# Patient Record
Sex: Male | Born: 1945 | Race: Black or African American | Hispanic: No | Marital: Married | Smoking: Former smoker
Health system: Southern US, Community
[De-identification: ages and names within clinical notes are randomized; demographics above are authoritative.]

## PROBLEM LIST (undated history)

## (undated) DIAGNOSIS — I2510 Atherosclerotic heart disease of native coronary artery without angina pectoris: Secondary | ICD-10-CM

## (undated) DIAGNOSIS — B1920 Unspecified viral hepatitis C without hepatic coma: Secondary | ICD-10-CM

## (undated) DIAGNOSIS — N183 Chronic kidney disease, stage 3 unspecified: Secondary | ICD-10-CM

## (undated) DIAGNOSIS — J189 Pneumonia, unspecified organism: Secondary | ICD-10-CM

## (undated) DIAGNOSIS — K635 Polyp of colon: Secondary | ICD-10-CM

## (undated) DIAGNOSIS — M79604 Pain in right leg: Secondary | ICD-10-CM

## (undated) DIAGNOSIS — M79605 Pain in left leg: Secondary | ICD-10-CM

## (undated) DIAGNOSIS — D649 Anemia, unspecified: Secondary | ICD-10-CM

## (undated) DIAGNOSIS — F32A Depression, unspecified: Secondary | ICD-10-CM

## (undated) DIAGNOSIS — I10 Essential (primary) hypertension: Secondary | ICD-10-CM

## (undated) DIAGNOSIS — Z8719 Personal history of other diseases of the digestive system: Secondary | ICD-10-CM

## (undated) DIAGNOSIS — G8929 Other chronic pain: Secondary | ICD-10-CM

## (undated) DIAGNOSIS — E7800 Pure hypercholesterolemia, unspecified: Secondary | ICD-10-CM

## (undated) DIAGNOSIS — K219 Gastro-esophageal reflux disease without esophagitis: Secondary | ICD-10-CM

## (undated) DIAGNOSIS — F329 Major depressive disorder, single episode, unspecified: Secondary | ICD-10-CM

## (undated) DIAGNOSIS — F1020 Alcohol dependence, uncomplicated: Secondary | ICD-10-CM

## (undated) DIAGNOSIS — M545 Low back pain, unspecified: Secondary | ICD-10-CM

## (undated) HISTORY — DX: Polyp of colon: K63.5

## (undated) HISTORY — PX: FOOT SURGERY: SHX648

## (undated) HISTORY — DX: Alcohol dependence, uncomplicated: F10.20

## (undated) HISTORY — PX: COLONOSCOPY W/ BIOPSIES AND POLYPECTOMY: SHX1376

## (undated) HISTORY — DX: Unspecified viral hepatitis C without hepatic coma: B19.20

## (undated) HISTORY — DX: Pain in left leg: M79.605

## (undated) HISTORY — DX: Pain in right leg: M79.604

## (undated) HISTORY — DX: Anemia, unspecified: D64.9

## (undated) HISTORY — PX: ESOPHAGOGASTRODUODENOSCOPY: SHX1529

## (undated) HISTORY — DX: Major depressive disorder, single episode, unspecified: F32.9

## (undated) HISTORY — DX: Essential (primary) hypertension: I10

## (undated) HISTORY — DX: Depression, unspecified: F32.A

---

## 1998-03-31 ENCOUNTER — Ambulatory Visit (HOSPITAL_COMMUNITY): Admission: RE | Admit: 1998-03-31 | Discharge: 1998-03-31 | Payer: Self-pay | Admitting: Cardiology

## 1999-11-29 ENCOUNTER — Encounter (INDEPENDENT_AMBULATORY_CARE_PROVIDER_SITE_OTHER): Payer: Self-pay

## 1999-11-29 ENCOUNTER — Ambulatory Visit (HOSPITAL_COMMUNITY): Admission: RE | Admit: 1999-11-29 | Discharge: 1999-11-29 | Payer: Self-pay | Admitting: Gastroenterology

## 2001-10-06 ENCOUNTER — Emergency Department (HOSPITAL_COMMUNITY): Admission: EM | Admit: 2001-10-06 | Discharge: 2001-10-06 | Payer: Self-pay | Admitting: Emergency Medicine

## 2002-10-01 ENCOUNTER — Other Ambulatory Visit (HOSPITAL_COMMUNITY): Admission: RE | Admit: 2002-10-01 | Discharge: 2002-10-17 | Payer: Self-pay | Admitting: Psychiatry

## 2003-08-17 ENCOUNTER — Encounter: Admission: RE | Admit: 2003-08-17 | Discharge: 2003-08-17 | Payer: Self-pay | Admitting: Cardiology

## 2003-08-31 ENCOUNTER — Encounter: Admission: RE | Admit: 2003-08-31 | Discharge: 2003-08-31 | Payer: Self-pay | Admitting: Cardiology

## 2003-11-12 ENCOUNTER — Encounter (INDEPENDENT_AMBULATORY_CARE_PROVIDER_SITE_OTHER): Payer: Self-pay | Admitting: *Deleted

## 2003-11-12 ENCOUNTER — Ambulatory Visit (HOSPITAL_COMMUNITY): Admission: RE | Admit: 2003-11-12 | Discharge: 2003-11-12 | Payer: Self-pay

## 2008-07-11 ENCOUNTER — Ambulatory Visit: Payer: Self-pay | Admitting: Internal Medicine

## 2008-07-25 ENCOUNTER — Encounter: Payer: Self-pay | Admitting: Internal Medicine

## 2008-07-25 ENCOUNTER — Ambulatory Visit: Payer: Self-pay | Admitting: Internal Medicine

## 2008-07-25 HISTORY — PX: COLONOSCOPY W/ BIOPSIES AND POLYPECTOMY: SHX1376

## 2008-07-28 ENCOUNTER — Encounter: Payer: Self-pay | Admitting: Internal Medicine

## 2009-11-19 ENCOUNTER — Encounter: Admission: RE | Admit: 2009-11-19 | Discharge: 2009-11-19 | Payer: Self-pay | Admitting: Cardiology

## 2010-02-07 ENCOUNTER — Encounter: Payer: Self-pay | Admitting: Cardiology

## 2011-02-14 ENCOUNTER — Telehealth: Payer: Self-pay | Admitting: Internal Medicine

## 2011-02-14 NOTE — Telephone Encounter (Signed)
Left a message for patient to call me. 

## 2011-02-14 NOTE — Telephone Encounter (Signed)
Spoke with patient and he states Dr. Shana Chute wanted him to see Dr. Juanda Chance. Asked patient to have Dr. Shana Chute to fax his notes and labs for Dr. Juanda Chance to review.

## 2011-02-16 NOTE — Telephone Encounter (Signed)
Spoke with Dr. Magda Kiel office and they will fax records and recent labs on patient.

## 2011-02-16 NOTE — Telephone Encounter (Signed)
Received labs and notes on patient. Left a message for patient to call me to schedule OV.

## 2011-02-17 ENCOUNTER — Encounter: Payer: Self-pay | Admitting: *Deleted

## 2011-02-17 NOTE — Telephone Encounter (Signed)
Spoke with patient and scheduled him on 02/23/11 at 9:30 AM.

## 2011-02-23 ENCOUNTER — Telehealth: Payer: Self-pay | Admitting: Internal Medicine

## 2011-02-23 ENCOUNTER — Ambulatory Visit: Payer: Self-pay | Admitting: Internal Medicine

## 2011-02-23 NOTE — Telephone Encounter (Signed)
Dr Juanda Chance- Do you want to charge?

## 2011-02-25 NOTE — Telephone Encounter (Signed)
No charge for late cancellation.

## 2011-04-11 ENCOUNTER — Encounter: Payer: Self-pay | Admitting: Internal Medicine

## 2011-06-08 ENCOUNTER — Ambulatory Visit: Payer: No Typology Code available for payment source | Admitting: Internal Medicine

## 2011-08-10 ENCOUNTER — Encounter: Payer: Self-pay | Admitting: Internal Medicine

## 2011-08-10 ENCOUNTER — Ambulatory Visit (INDEPENDENT_AMBULATORY_CARE_PROVIDER_SITE_OTHER): Payer: No Typology Code available for payment source | Admitting: Internal Medicine

## 2011-08-10 VITALS — BP 134/76 | HR 88 | Ht 69.0 in | Wt 177.0 lb

## 2011-08-10 DIAGNOSIS — D649 Anemia, unspecified: Secondary | ICD-10-CM

## 2011-08-10 DIAGNOSIS — B182 Chronic viral hepatitis C: Secondary | ICD-10-CM

## 2011-08-10 DIAGNOSIS — D509 Iron deficiency anemia, unspecified: Secondary | ICD-10-CM

## 2011-08-10 NOTE — Patient Instructions (Addendum)
You have been scheduled for an endoscopy with propofol. Please follow written instructions given to you at your visit today. If you use inhalers (even only as needed), please bring them with you on the day of your procedure. Your physician has requested that you have the following lab work at the [place] [facility]: CBC, Hepatic Function Panel, IBC, B12 level, PT and Ammonia CC: Dr Donia Guiles

## 2011-08-10 NOTE — Progress Notes (Signed)
Marcus Montgomery [birth year] MRN [id number]  History of Present Illness:  This is a 66 year old African American male who reports dark stools intermittently for the past several months. He denies abdominal pain, nausea, vomiting or significant GI history. We have seen him in the past for hepatitis C. He had a liver biopsy in 2005 each confirmed grade 2/4 activity and stage 3/4 fibrosis due to  Hepatitis C  with 1 regenerative nodule as well as septal fibrosis and portal inflammation. There was a limiting plate necrosis. He was treated with interferon in 2006 and currently has undetectable viral RNA by PCR. He has been followed at the [facility] [place]. His last colonoscopy was in July 2010 and it showed a hyperplastic polyp. He had a hyperplastic polyp in 2001. He is mildly anemic. His last hemoglobin in January of this year was 11.8. His hematocrit was 35.1 with an MCV of 84. He is going to have blood tests drawn at the [facility] in the next 2 weeks. He has been taking Naprosyn and ibuprofen almost on a daily basis.   Past Medical History  Diagnosis Date  . Anemia   . Hepatitis C     as per liver biopsy  . Hyperplastic colon polyp   . Hypertension   . Depression   . Alcoholism    Past Surgical History  Procedure Date  . Foot surgery     reports that he has quit smoking. He has never used smokeless tobacco. He reports that he does not drink alcohol or use illicit drugs. family history is negative for Colon cancer. No Known Allergies      Review of Systems:Denies heartburn odynophagia dysphagia  The remainder of the 10 point ROS is negative except as outlined in H&P   Physical Exam: General appearance  Well developed, in no distress. Eyes- non icteric. HEENT nontraumatic, normocephalic. Mouth no lesions, tongue papillated, no cheilosis. Neck supple without adenopathy, thyroid not enlarged, no carotid bruits, no JVD. Lungs Clear to auscultation bilaterally. Cor normal S1,  normal S2, regular rhythm, no murmur,  quiet precordium. Abdomen: Mildly protuberant with diastasis recti , normal active bowel sounds. Liver edge at costal margin. No ascites. No tenderness.  Rectal:Soft Hemoccult negative stool . Extremities no pedal edema. Scar left lower leg. Skin no lesions. Neurological alert and oriented x 3.No asterixis  Psychological normal mood and affect.  Assessment and Plan:  Problem #1 Patient with mild anemia who reports dark stools but he is Hemoccult negative on today's exam.and anemic. He has chronic liver disease followed at the [facility]. He uses anti-inflammatory agents which could be causing gastropathy and bleeding. He may also have an element of portal hypertension. If he has esophageal varices, he should stop taking Naprosyn. We will go ahead with an upper endoscopy to assess for varices and gastritis. He will need a repeat CBC, liver function tests, iron studies, B12, prothrombin time and ammonia which will be done at the [facility] and hopefully they will send the results to [place] as we have requested.  Problem #2 Colon cancer screening. He is the due for a recall colonoscopy in 2017.  Problem #3 chronic hepatitis C. Currently undetectable viral load area he has completed his therapy. He still have his liver function tests follow on regular basis. Had upper abdominal ultrasound every 2 years and check alpha-fetoprotein yearly   08/10/2011 Marcus Montgomery

## 2011-09-21 ENCOUNTER — Other Ambulatory Visit: Payer: Self-pay | Admitting: *Deleted

## 2011-09-21 ENCOUNTER — Ambulatory Visit (AMBULATORY_SURGERY_CENTER): Payer: No Typology Code available for payment source | Admitting: Internal Medicine

## 2011-09-21 ENCOUNTER — Encounter: Payer: Self-pay | Admitting: Internal Medicine

## 2011-09-21 VITALS — BP 127/74 | HR 76 | Temp 96.9°F | Resp 26 | Ht 69.0 in | Wt 177.0 lb

## 2011-09-21 DIAGNOSIS — K269 Duodenal ulcer, unspecified as acute or chronic, without hemorrhage or perforation: Secondary | ICD-10-CM

## 2011-09-21 DIAGNOSIS — K2980 Duodenitis without bleeding: Secondary | ICD-10-CM

## 2011-09-21 DIAGNOSIS — D649 Anemia, unspecified: Secondary | ICD-10-CM

## 2011-09-21 DIAGNOSIS — K210 Gastro-esophageal reflux disease with esophagitis: Secondary | ICD-10-CM

## 2011-09-21 DIAGNOSIS — K2900 Acute gastritis without bleeding: Secondary | ICD-10-CM

## 2011-09-21 MED ORDER — OMEPRAZOLE 20 MG PO CPDR: 20.0000 mg | DELAYED_RELEASE_CAPSULE | Freq: Every day | ORAL | Status: DC

## 2011-09-21 MED ORDER — SODIUM CHLORIDE 0.9 % IV SOLN: 500.0000 mL | INTRAVENOUS | Status: DC

## 2011-09-21 MED ORDER — FERROUS BISGLYCINATE CHELATE POWD: 1.0000 | Freq: Two times a day (BID) | Status: DC

## 2011-09-21 NOTE — Op Note (Signed)
[facility] [address] [place], [address]   ENDOSCOPY PROCEDURE REPORT  PATIENT: Marcus Montgomery, Marcus Montgomery  MR#: [id number] BIRTHDATE: Mar 03, 1945 , 65  yrs. old GENDER: Male ENDOSCOPIST: Hart Carwin, MD REFERRED BY:  Donia Guiles, M.D. PROCEDURE DATE:  09/21/2011 PROCEDURE:  EGD w/ biopsy ASA CLASS:     Class II INDICATIONS:  anemia.   iron deficiency anemia.   melena, Hgb 11.3, heme negative stool on 08/10/2011, taking NSAIDS,. MEDICATIONS: MAC sedation, administered by CRNA and Propofol (Diprivan) 280 mg IV TOPICAL ANESTHETIC: Cetacaine Spray  DESCRIPTION OF PROCEDURE: After the risks benefits and alternatives of the procedure were thoroughly explained, informed consent was obtained.  The [facility] [id number] endoscope was introduced through the mouth and advanced to the second portion of the duodenum. Without limitations.  The instrument was slowly withdrawn as the mucosa was fully examined.      DUODENUM: A single round, shallow and clean-based ulcer ranging between 5-2mm in size was found in the duodenal bulb.  Biopsies were taken at edge of the ulcer.   Mild duodenal inflammation was found in the duodenal bulb.  ESOPHAGUS: Reflux esophagitis was found in the lower third of the esophagus.  Esophagitis was LA Grade A: breaks across 1-2 folds, <5 mm.  A biopsy was performed.  Sample obtained for helicobacter pylori testing using rapid urease test.   No varices noted  STOMACH: There was mild antral gastropathy noted.  Cold forcep biopsies were taken at the antrum.  Multiple antral erosions covered with old blood,   The scope was then withdrawn from the patient and the procedure completed.  COMPLICATIONS: There were no complications. ENDOSCOPIC IMPRESSION: 1.   Single ulcer ranging between 5-1mm in size was found in the duodenal bulb , no stigmata of recent bleeding,likely due to NSAIDs 2.   Duodenal inflammation was found in the duodenal bulb 3.    Esophagitis consistent with reflux esophagitis in the lower third of the esophagus; biopsy 4.  NSAID gastritis, multiple esosions 5. No varices  RECOMMENDATIONS: 1.  anti-reflux regimen to be follow 2.  continue PPI 3.  Avoid NSAIDS 4. FeSO4 325 mg po tid 5. repeat CBC in 2 weeks  REPEAT EXAM: .  no recall  eSigned:  Hart Carwin, MD 09/21/2011 2:43 PM   CC:  PATIENT NAME:  Marcus Montgomery, Marcus Montgomery MR#: [id number]

## 2011-09-21 NOTE — Patient Instructions (Addendum)
Handouts were given to your care partner on anti-reflux regimen and gastritis.  Per Dr. Juanda Chance continue taking acid reducing medication.  Rx for omeprazole 20 mg take one daily given to your care partner  Avoid NSAIDS medications.  Take Iron 325 mg by mouth two times per day Rx also given.  Dr. Juanda Chance would like to repeat CBC blood work in 4 weeks the order is in the computer and you can get this done on Oct. 4th.  Please call if any questions or concerns.    YOU HAD AN ENDOSCOPIC PROCEDURE TODAY AT THE [facility]: Refer to the procedure report that was given to you for any specific questions about what was found during the examination.  If the procedure report does not answer your questions, please call your gastroenterologist to clarify.  If you requested that your care partner not be given the details of your procedure findings, then the procedure report has been included in a sealed envelope for you to review at your convenience later.  YOU SHOULD EXPECT: Some feelings of bloating in the abdomen. Passage of more gas than usual.  Walking can help get rid of the air that was put into your GI tract during the procedure and reduce the bloating. If you had a lower endoscopy (such as a colonoscopy or flexible sigmoidoscopy) you may notice spotting of blood in your stool or on the toilet paper. If you underwent a bowel prep for your procedure, then you may not have a normal bowel movement for a few days.  DIET: Your first meal following the procedure should be a light meal and then it is ok to progress to your normal diet.  A half-sandwich or bowl of soup is an example of a good first meal.  Heavy or fried foods are harder to digest and may make you feel nauseous or bloated.  Likewise meals heavy in dairy and vegetables can cause extra gas to form and this can also increase the bloating.  Drink plenty of fluids but you should avoid alcoholic beverages for 24 hours.  ACTIVITY: Your care partner  should take you home directly after the procedure.  You should plan to take it easy, moving slowly for the rest of the day.  You can resume normal activity the day after the procedure however you should NOT DRIVE or use heavy machinery for 24 hours (because of the sedation medicines used during the test).    SYMPTOMS TO REPORT IMMEDIATELY: A gastroenterologist can be reached at any hour.  During normal business hours, 8:30 AM to 5:00 PM Monday through Friday, call [phone].  After hours and on weekends, please call the GI answering service at [phone] who will take a message and have the physician on call contact you.     Following upper endoscopy (EGD)  Vomiting of blood or coffee ground material  New chest pain or pain under the shoulder blades  Painful or persistently difficult swallowing  New shortness of breath  Fever of 100F or higher  Black, tarry-looking stools  FOLLOW UP: If any biopsies were taken you will be contacted by phone or by letter within the next 1-3 weeks.  Call your gastroenterologist if you have not heard about the biopsies in 3 weeks.  Our staff will call the home number listed on your records the next business day following your procedure to check on you and address any questions or concerns that you may have at that time regarding the information  given to you following your procedure. This is a courtesy call and so if there is no answer at the home number and we have not heard from you through the emergency physician on call, we will assume that you have returned to your regular daily activities without incident.  SIGNATURES/CONFIDENTIALITY: You and/or your care partner have signed paperwork which will be entered into your electronic medical record.  These signatures attest to the fact that that the information above on your After Visit Summary has been reviewed and is understood.  Full responsibility of the confidentiality of this discharge information  lies with you and/or your care-partner.

## 2011-09-21 NOTE — Progress Notes (Signed)
No complaints noted in the recovery room. Maw   

## 2011-09-21 NOTE — Progress Notes (Signed)
Pt was in the recovery room longer than 30 mins waiting for Dr. Juanda Chance to talk with the pt and his wife.maw  No complaints noted in the recovery room. Maw  Patient did not experience any of the following events: a burn prior to discharge; a fall within the facility; wrong site/side/patient/procedure/implant event; or a hospital transfer or hospital admission upon discharge from the facility. [id number]) Patient did not have preoperative order for IV antibiotic SSI prophylaxis. [phone])

## 2011-09-22 ENCOUNTER — Other Ambulatory Visit: Payer: Self-pay | Admitting: Internal Medicine

## 2011-09-22 ENCOUNTER — Telehealth: Payer: Self-pay

## 2011-09-22 DIAGNOSIS — N289 Disorder of kidney and ureter, unspecified: Secondary | ICD-10-CM

## 2011-09-22 NOTE — Telephone Encounter (Signed)
Left message on answering machine. 

## 2011-09-26 ENCOUNTER — Encounter: Payer: Self-pay | Admitting: Internal Medicine

## 2011-09-30 ENCOUNTER — Telehealth: Payer: Self-pay | Admitting: *Deleted

## 2011-09-30 NOTE — Telephone Encounter (Signed)
Spoke with patient and reminded him of labs due week on 10/05/11.

## 2011-09-30 NOTE — Telephone Encounter (Signed)
Message copied by Daphine Deutscher on Fri Sep 30, 2011  4:50 PM ------      Message from: Daphine Deutscher      Created: Wed Sep 21, 2011  3:36 PM       Call and remind patient due for CBC for DB on 9/18

## 2011-10-12 ENCOUNTER — Other Ambulatory Visit (INDEPENDENT_AMBULATORY_CARE_PROVIDER_SITE_OTHER): Payer: No Typology Code available for payment source

## 2011-10-12 DIAGNOSIS — D649 Anemia, unspecified: Secondary | ICD-10-CM

## 2011-10-12 LAB — CBC WITH DIFFERENTIAL/PLATELET
Eosinophils Relative: 1.5 % (ref 0.0–5.0)
HCT: 35.1 % — ABNORMAL LOW (ref 39.0–52.0)
Lymphs Abs: 2.4 10*3/uL (ref 0.7–4.0)
Monocytes Relative: 8.9 % (ref 3.0–12.0)
Platelets: 219 10*3/uL (ref 150.0–400.0)
RBC: 4.19 Mil/uL — ABNORMAL LOW (ref 4.22–5.81)
WBC: 6.6 10*3/uL (ref 4.5–10.5)

## 2011-10-13 ENCOUNTER — Other Ambulatory Visit: Payer: Self-pay | Admitting: *Deleted

## 2011-10-13 DIAGNOSIS — D649 Anemia, unspecified: Secondary | ICD-10-CM

## 2011-11-24 ENCOUNTER — Telehealth: Payer: Self-pay | Admitting: *Deleted

## 2011-11-24 NOTE — Telephone Encounter (Signed)
Message copied by Daphine Deutscher on Thu Nov 24, 2011 10:56 AM ------      Message from: Daphine Deutscher      Created: Fri Oct 14, 2011  9:33 AM       Call and remind CBC due for DB 11/28/11

## 2011-11-24 NOTE — Telephone Encounter (Signed)
Left a message for patient to call me. 

## 2011-11-24 NOTE — Telephone Encounter (Signed)
Spoke with patient and reminded him of lab due for Dr. Juanda Chance.

## 2011-12-02 ENCOUNTER — Other Ambulatory Visit (INDEPENDENT_AMBULATORY_CARE_PROVIDER_SITE_OTHER): Payer: No Typology Code available for payment source

## 2011-12-02 DIAGNOSIS — D649 Anemia, unspecified: Secondary | ICD-10-CM

## 2011-12-02 DIAGNOSIS — N289 Disorder of kidney and ureter, unspecified: Secondary | ICD-10-CM

## 2011-12-02 LAB — CBC WITH DIFFERENTIAL/PLATELET
Basophils Relative: 0.5 % (ref 0.0–3.0)
Eosinophils Relative: 1.4 % (ref 0.0–5.0)
HCT: 34.9 % — ABNORMAL LOW (ref 39.0–52.0)
Lymphs Abs: 2.2 10*3/uL (ref 0.7–4.0)
MCV: 84.6 fl (ref 78.0–100.0)
Monocytes Absolute: 0.3 10*3/uL (ref 0.1–1.0)
Platelets: 188 10*3/uL (ref 150.0–400.0)
RBC: 4.13 Mil/uL — ABNORMAL LOW (ref 4.22–5.81)
WBC: 5.9 10*3/uL (ref 4.5–10.5)

## 2011-12-02 LAB — BASIC METABOLIC PANEL
BUN: 16 mg/dL (ref 6–23)
Calcium: 9.1 mg/dL (ref 8.4–10.5)
Creatinine, Ser: 1.6 mg/dL — ABNORMAL HIGH (ref 0.4–1.5)
GFR: 58.31 mL/min — ABNORMAL LOW (ref >60.00)
Glucose, Bld: 159 mg/dL — ABNORMAL HIGH (ref 70–99)

## 2012-01-18 DIAGNOSIS — M79604 Pain in right leg: Secondary | ICD-10-CM

## 2012-01-18 HISTORY — DX: Pain in right leg: M79.604

## 2012-05-18 ENCOUNTER — Other Ambulatory Visit: Payer: Self-pay | Admitting: *Deleted

## 2012-05-18 DIAGNOSIS — I739 Peripheral vascular disease, unspecified: Secondary | ICD-10-CM

## 2012-06-20 ENCOUNTER — Encounter: Payer: Self-pay | Admitting: Vascular Surgery

## 2012-06-21 ENCOUNTER — Encounter (INDEPENDENT_AMBULATORY_CARE_PROVIDER_SITE_OTHER): Payer: No Typology Code available for payment source | Admitting: *Deleted

## 2012-06-21 ENCOUNTER — Encounter: Payer: Self-pay | Admitting: Vascular Surgery

## 2012-06-21 ENCOUNTER — Ambulatory Visit (INDEPENDENT_AMBULATORY_CARE_PROVIDER_SITE_OTHER): Payer: No Typology Code available for payment source | Admitting: Vascular Surgery

## 2012-06-21 VITALS — BP 100/68 | HR 76 | Resp 16 | Ht 69.0 in | Wt 175.0 lb

## 2012-06-21 DIAGNOSIS — R531 Weakness: Secondary | ICD-10-CM | POA: Insufficient documentation

## 2012-06-21 DIAGNOSIS — I739 Peripheral vascular disease, unspecified: Secondary | ICD-10-CM | POA: Insufficient documentation

## 2012-06-21 DIAGNOSIS — M79609 Pain in unspecified limb: Secondary | ICD-10-CM | POA: Insufficient documentation

## 2012-06-21 DIAGNOSIS — R200 Anesthesia of skin: Secondary | ICD-10-CM

## 2012-06-21 DIAGNOSIS — R209 Unspecified disturbances of skin sensation: Secondary | ICD-10-CM

## 2012-06-21 DIAGNOSIS — R5383 Other fatigue: Secondary | ICD-10-CM
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
## 2012-12-21 ENCOUNTER — Other Ambulatory Visit: Payer: Self-pay | Admitting: *Deleted

## 2012-12-21 DIAGNOSIS — G8929 Other chronic pain: Secondary | ICD-10-CM

## 2013-02-02 ENCOUNTER — Ambulatory Visit
Admission: RE | Admit: 2013-02-02 | Discharge: 2013-02-02 | Disposition: A | Discharge status: Home / Self Care | Payer: Non-veteran care | Source: Ambulatory Visit | Attending: *Deleted | Admitting: *Deleted

## 2013-02-02 DIAGNOSIS — G8929 Other chronic pain: Secondary | ICD-10-CM

## 2013-02-02 DIAGNOSIS — M549 Dorsalgia, unspecified: Principal | ICD-10-CM

## 2013-02-13 ENCOUNTER — Telehealth: Payer: Self-pay | Admitting: *Deleted

## 2013-02-13 NOTE — Telephone Encounter (Signed)
Pt request refill of Hydrocodone.  The Hydrocodone rx was written by another medical facility in 2013.  I informed pt of this, and he said he has an appt with DR Paulla Dolly in 02/2013, but hurts very bad and would like to see someone soon.  I referred to schedulers.

## 2013-02-13 NOTE — Telephone Encounter (Signed)
Rescheduled pt to 02-19-13

## 2013-02-19 ENCOUNTER — Encounter: Payer: Self-pay | Admitting: Podiatry

## 2013-02-19 ENCOUNTER — Ambulatory Visit (INDEPENDENT_AMBULATORY_CARE_PROVIDER_SITE_OTHER): Payer: PRIVATE HEALTH INSURANCE | Admitting: Podiatry

## 2013-02-19 VITALS — BP 119/71 | HR 78 | Resp 16 | Ht 69.0 in | Wt 179.0 lb

## 2013-02-19 DIAGNOSIS — M778 Other enthesopathies, not elsewhere classified: Secondary | ICD-10-CM

## 2013-02-19 DIAGNOSIS — M7750 Other enthesopathy of unspecified foot: Secondary | ICD-10-CM

## 2013-02-19 DIAGNOSIS — M2040 Other hammer toe(s) (acquired), unspecified foot: Secondary | ICD-10-CM

## 2013-02-19 DIAGNOSIS — M779 Enthesopathy, unspecified: Secondary | ICD-10-CM

## 2013-02-19 DIAGNOSIS — Q828 Other specified congenital malformations of skin: Secondary | ICD-10-CM

## 2013-02-19 NOTE — Progress Notes (Signed)
Saw dr Paulla Dolly last year for this toe and the bottom of the foot, he gave me some injections, and some pads and hydrocodone. Dr Paulla Dolly said that it was just a short term fix and i need a refill on prescription , but they told me i had to see dr regal first, but they could not get me in until next week and i am in pain.   Objective: Vital signs are stable he is alert and oriented x3. Pulses are palpable left lower extremity. He has a cocked up fifth digit of his left foot which is exquisitely painful in shoe gear with an overlying reactive hyperkeratosis to the PIPJ fifth left. He also has a porokeratotic sub-fifth metatarsal head of the left foot. This is more than likely associated with the hammertoe deformity at the level of the fifth metatarsal head.  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 Porokeratotic lesion sub-fifth metatarsal of the left foot. Localized capsulitis PIPJ fifth digit left.  Plan: Discussed etiology pathology conservative versus surgical therapies. At this point I debrided all reactive hyperkeratosis his and injected dexamethasone sublesionally today for localize capsulitis of the fifth toe. I discussed in detail with him today the possibility of amputating the fifth toe as an option to alleviate his symptoms. There has been previous surgery performed at the fifth metatarsophalangeal joint that has resulted in contracture deformity of the fifth toe. I do not feel that surgical correction of this toe would be successful. I am referring him back to Dr. Paulla Dolly for surgical consideration. He was requesting Vicodin today which I declined to provide.

## 2013-02-28 ENCOUNTER — Encounter: Payer: Self-pay | Admitting: Podiatry

## 2013-02-28 ENCOUNTER — Ambulatory Visit (INDEPENDENT_AMBULATORY_CARE_PROVIDER_SITE_OTHER): Payer: PRIVATE HEALTH INSURANCE | Admitting: Podiatry

## 2013-02-28 ENCOUNTER — Ambulatory Visit: Payer: Self-pay | Admitting: Podiatry

## 2013-02-28 ENCOUNTER — Ambulatory Visit (INDEPENDENT_AMBULATORY_CARE_PROVIDER_SITE_OTHER): Payer: PRIVATE HEALTH INSURANCE

## 2013-02-28 VITALS — BP 140/86 | HR 80 | Resp 12

## 2013-02-28 DIAGNOSIS — R52 Pain, unspecified: Secondary | ICD-10-CM

## 2013-02-28 DIAGNOSIS — Z472 Encounter for removal of internal fixation device: Secondary | ICD-10-CM

## 2013-02-28 DIAGNOSIS — M2040 Other hammer toe(s) (acquired), unspecified foot: Secondary | ICD-10-CM

## 2013-02-28 DIAGNOSIS — M216X9 Other acquired deformities of unspecified foot: Secondary | ICD-10-CM

## 2013-02-28 MED ORDER — HYDROCODONE-ACETAMINOPHEN 10-325 MG PO TABS: 1.0000 | ORAL_TABLET | Freq: Three times a day (TID) | ORAL | Status: DC | PRN

## 2013-02-28 NOTE — Progress Notes (Signed)
Subjective:     Patient ID: Marcus Montgomery, male   DOB: 1945/12/16, 68 y.o.   MRN: [id number]  HPI patient presents stating I'm having a lot of pain in my left fifth toe underneath my left fifth metatarsal bone that is becoming increasingly hard for me to wear shoe gear or walk with. Had a trim by Dr. Milinda Pointer which was not effective for him   Review of Systems     Objective:   Physical Exam Neurovascular status intact with pain to palpation plantar aspect left fifth metatarsal head and pain to palpation left fifth toe with elevation of the toe secondary to previous surgery performed on the MPJ. There is contracture noted at the fifth MPJ secondary to previous work    Assessment:     Previous surgery with elevation of the fifth toe contraction at the fifth MPJ and plantar keratotic lesion fifth metatarsal head with screw in place    Plan:     Reviewed condition at great length and discussed continued conservative versus surgical treatment. Patient wants surgical treatment due to long-standing nature of deformity and increased pain over this period of time. At this point I did discuss arthroplasty versus amputation and we're going to try for arthroplasty first with the understanding of eventually he may require amputation of the fifth toe I have recommended removal of screw and fifth metatarsal head resection a long with arthroplasty fifth toe and I spent a great of time going over with him what would be done and allowing him to read a consent form [facility] discussing all possible complications that can occur with surgical intervention. Patient is scheduled for outpatient surgery for fifth metatarsal head resection arthroplasty with release of the fifth MPJ to lower the toe and understand recovery will be approximately 6 months to one year scheduled for outpatient surgery

## 2013-03-06 ENCOUNTER — Telehealth: Payer: Self-pay | Admitting: *Deleted

## 2013-03-07 NOTE — Telephone Encounter (Signed)
Forward to Lisa 

## 2013-03-08 ENCOUNTER — Telehealth: Payer: Self-pay | Admitting: *Deleted

## 2013-03-08 NOTE — Telephone Encounter (Signed)
Pt scheduled surgery with Dr Paulla Dolly for 04/23/2013 - L 5th Metatarsal Head Resection, L 5th Removal Fixation Deep, and L 5th Hammer Toe Repair.

## 2013-03-26 ENCOUNTER — Encounter: Payer: Self-pay | Admitting: Podiatry

## 2013-03-26 DIAGNOSIS — M21549 Acquired clubfoot, unspecified foot: Secondary | ICD-10-CM

## 2013-03-26 DIAGNOSIS — M2040 Other hammer toe(s) (acquired), unspecified foot: Secondary | ICD-10-CM

## 2013-03-26 DIAGNOSIS — Z472 Encounter for removal of internal fixation device: Secondary | ICD-10-CM

## 2013-03-27 ENCOUNTER — Telehealth: Payer: Self-pay | Admitting: *Deleted

## 2013-03-27 MED ORDER — HYDROCODONE-ACETAMINOPHEN 10-325 MG PO TABS: 1.0000 | ORAL_TABLET | Freq: Three times a day (TID) | ORAL | Status: DC | PRN

## 2013-03-27 NOTE — Telephone Encounter (Addendum)
Pt states the pain medicine prescribed after his surgery 03/26/2013 is giving him a horrible headache.  Pt request the same pain medicine Dr Paulla Dolly prescribed previously.  Dr Paulla Dolly orders Norco as previously ordered dispense #30.

## 2013-04-01 ENCOUNTER — Ambulatory Visit (INDEPENDENT_AMBULATORY_CARE_PROVIDER_SITE_OTHER): Payer: PRIVATE HEALTH INSURANCE

## 2013-04-01 ENCOUNTER — Ambulatory Visit (INDEPENDENT_AMBULATORY_CARE_PROVIDER_SITE_OTHER): Payer: PRIVATE HEALTH INSURANCE | Admitting: Podiatry

## 2013-04-01 VITALS — BP 162/78 | HR 77 | Resp 16 | Ht 69.0 in | Wt 178.0 lb

## 2013-04-01 DIAGNOSIS — Z9889 Other specified postprocedural states: Secondary | ICD-10-CM

## 2013-04-01 DIAGNOSIS — M2040 Other hammer toe(s) (acquired), unspecified foot: Secondary | ICD-10-CM

## 2013-04-01 NOTE — Progress Notes (Signed)
   Subjective:    Patient ID: Marcus Montgomery, male    DOB: February 07, 1945, 68 y.o.   MRN: [id number] POV1 - Left foot.  Left foot is without redness, and has slight swelling, and the suture lines are intact. HPI    Review of Systems     Objective:   Physical Exam        Assessment & Plan:

## 2013-04-01 NOTE — Progress Notes (Signed)
Subjective:     Patient ID: Marcus Montgomery, male   DOB: 12-11-1945, 68 y.o.   MRN: [id number]  HPI patient states he is doing fine with his foot and is able to walk with a minimal amount of discomfort at the current time   Review of Systems     Objective:   Physical Exam Neurovascular status intact with negative Homans sign and patient well oriented x3 incision sites on the left foot are healing well with fifth digit in good position    Assessment:     Doing well with good alignment of the left foot with wound edges well coapted no drainage or edema noted    Plan:     X-rays reviewed and discussed condition and applied sterile dressing. Reappoint for [place] to recheck again in 2 weeks for suture removal earlier if necessary

## 2013-04-09 ENCOUNTER — Telehealth: Payer: Self-pay | Admitting: *Deleted

## 2013-04-09 NOTE — Telephone Encounter (Signed)
Calling to inquire whether or not pre-medication is needed prior to doing a dental procedure.  I informed her pre-medication was not needed.  She requested a written response as well.  Note was faxed.

## 2013-04-12 ENCOUNTER — Telehealth: Payer: Self-pay | Admitting: *Deleted

## 2013-04-12 MED ORDER — HYDROCODONE-ACETAMINOPHEN 10-325 MG PO TABS: 1.0000 | ORAL_TABLET | Freq: Three times a day (TID) | ORAL | Status: DC | PRN

## 2013-04-12 NOTE — Telephone Encounter (Signed)
I called and informed him Dr. Cala Bradford the refill.  He'll have to come by to pick it up, can't be called into the pharmacy.  He stated he'll be here in about 25 minutes.

## 2013-04-12 NOTE — Telephone Encounter (Signed)
Will have to get from one of the other Doc's

## 2013-04-12 NOTE — Telephone Encounter (Signed)
I need a refill prescription Hydrocone.  Return my call let me know what's going on.

## 2013-04-15 ENCOUNTER — Telehealth: Payer: Self-pay | Admitting: *Deleted

## 2013-04-15 ENCOUNTER — Ambulatory Visit: Payer: PRIVATE HEALTH INSURANCE | Admitting: *Deleted

## 2013-04-15 VITALS — BP 111/70 | HR 92 | Resp 16 | Ht 68.0 in | Wt 179.0 lb

## 2013-04-15 DIAGNOSIS — M2040 Other hammer toe(s) (acquired), unspecified foot: Secondary | ICD-10-CM

## 2013-04-15 NOTE — Telephone Encounter (Signed)
Message copied by Lolita Rieger on Mon Apr 15, 2013  4:00 PM ------      Message from: [employer], [place] M      Created: Mon Apr 15, 2013  3:04 PM      Contact: [phone]       Patient came in this morning to have the stitches removed. Patient stated only the stitches on the top were removed and not on the bottom. Patient has questions. Please call patient back today! ------

## 2013-04-15 NOTE — Telephone Encounter (Signed)
I returned his call we will get him to see the nurse on tomorrow to remove those stitches.

## 2013-04-15 NOTE — Progress Notes (Signed)
Pt presents of suture removal of left 5th toe and lateral foot,  Suture line is clean and approximated without redness or drainage.  I removed all sutures and covered left 5th toe with coflex and fitted pt with a compression anklet.  I instructed pt to shower at night in 24 - 48 hours.  I instructed pt to use the anklet every day and to coflex the toe daily and to gradually transfer to an athletic shoe in 2 weeks.

## 2013-04-20 ENCOUNTER — Emergency Department (INDEPENDENT_AMBULATORY_CARE_PROVIDER_SITE_OTHER)
Admission: EM | Admit: 2013-04-20 | Discharge: 2013-04-20 | Disposition: A | Discharge status: Home / Self Care | Payer: 59 | Source: Home / Self Care | Attending: Family Medicine | Admitting: Family Medicine

## 2013-04-20 ENCOUNTER — Emergency Department (INDEPENDENT_AMBULATORY_CARE_PROVIDER_SITE_OTHER): Payer: Non-veteran care

## 2013-04-20 ENCOUNTER — Encounter (HOSPITAL_COMMUNITY): Payer: Self-pay | Admitting: Emergency Medicine

## 2013-04-20 DIAGNOSIS — J069 Acute upper respiratory infection, unspecified: Secondary | ICD-10-CM

## 2013-04-20 MED ORDER — AZITHROMYCIN 250 MG PO TABS: ORAL_TABLET | ORAL | Status: DC

## 2013-04-20 MED ORDER — HYDROCOD POLST-CHLORPHEN POLST 10-8 MG/5ML PO LQCR: 5.0000 mL | Freq: Two times a day (BID) | ORAL | Status: DC | PRN

## 2013-04-20 MED ORDER — FLUTICASONE PROPIONATE 50 MCG/ACT NA SUSP: 1.0000 | Freq: Two times a day (BID) | NASAL | Status: DC

## 2013-04-20 NOTE — ED Notes (Signed)
Pt  Reports  Symptoms  Of  Cough  Congestion  Body  Aches  And  Chills  For  Several  Days              Pt sitting  Upright on  exanm  Table  Speaking in  Complete  sentances  And  Is  In no  Acute  Distress

## 2013-04-20 NOTE — ED Provider Notes (Signed)
CSN: [id number]     Arrival date & time 04/20/13  1029 History   First MD Initiated Contact with Patient 04/20/13 1136     Chief Complaint  Patient presents with  . URI   (Consider location/radiation/quality/duration/timing/severity/associated sxs/prior Treatment) Patient is a 68 y.o. male presenting with URI. The history is provided by the patient.  URI Presenting symptoms: congestion, cough, fever and rhinorrhea   Severity:  Moderate Onset quality:  Gradual Duration:  1 week Progression:  Worsening Chronicity:  New Relieved by:  None tried Worsened by:  Nothing tried Ineffective treatments:  None tried Associated symptoms: no wheezing     Past Medical History  Diagnosis Date  . Anemia   . Hepatitis C     as per liver biopsy  . Hyperplastic colon polyp   . Hypertension   . Depression   . Alcoholism   . Leg pain, bilateral 2014   Past Surgical History  Procedure Laterality Date  . Foot surgery     Family History  Problem Relation Age of Onset  . Colon cancer Neg Hx   . Heart disease Father   . Hypertension Father   . Heart attack Father   . Cancer Mother   . Cancer Sister   . Heart disease Brother   . Hypertension Brother   . Heart attack Brother    History  Substance Use Topics  . Smoking status: Former Research scientist (life sciences)  . Smokeless tobacco: Never Used  . Alcohol Use: No    Review of Systems  Constitutional: Positive for fever.  HENT: Positive for congestion and rhinorrhea.   Respiratory: Positive for cough. Negative for shortness of breath and wheezing.   Gastrointestinal: Negative.     Allergies  Demerol  Home Medications   Current Outpatient Rx  Name  Route  Sig  Dispense  Refill  . acetaminophen (TYLENOL) 325 MG tablet   Oral   Take 650 mg by mouth continuous as needed for pain.         Marland Kitchen azithromycin (ZITHROMAX Z-PAK) 250 MG tablet      Take as directed on pack   6 each   0   . BENICAR HCT 40-12.5 MG per tablet      Takes one tablet by  mouth once daily         . chlorpheniramine-HYDROcodone (TUSSIONEX PENNKINETIC ER) 10-8 MG/5ML LQCR   Oral   Take 5 mLs by mouth every 12 (twelve) hours as needed for cough.   115 mL   0   . Ferrous Bisglycinate Chelate POWD   Does not apply   1 tablet by Does not apply route 2 (two) times daily.   1 Bottle   1   . fluticasone (FLONASE) 50 MCG/ACT nasal spray   Each Nare   Place 1 spray into both nostrils 2 (two) times daily.   1 g   2   . HYDROcodone-acetaminophen (NORCO) 10-325 MG per tablet   Oral   Take 1 tablet by mouth every 8 (eight) hours as needed.   30 tablet   0    BP 145/81  Pulse 78  Temp(Src) 97.9 F (36.6 C) (Oral)  Resp 18  SpO2 97% Physical Exam  Nursing note and vitals reviewed. Constitutional: He is oriented to person, place, and time. He appears well-developed and well-nourished. No distress.  HENT:  Head: Normocephalic.  Right Ear: External ear normal.  Left Ear: External ear normal.  Mouth/Throat: Oropharynx is clear and moist.  Neck:  Normal range of motion. Neck supple.  Cardiovascular: Normal heart sounds.   Pulmonary/Chest: Effort normal and breath sounds normal.  Lymphadenopathy:    He has no cervical adenopathy.  Neurological: He is alert and oriented to person, place, and time.  Skin: Skin is warm and dry.    ED Course  Procedures (including critical care time) Labs Review Labs Reviewed - No data to display Imaging Review No results found.  X-rays reviewed and report per radiologist.  MDM   1. URI (upper respiratory infection)        Billy Fischer, MD 04/22/13 2110

## 2013-04-26 NOTE — Progress Notes (Signed)
Dr Paulla Dolly ordered - removal 5th metatarsal head left foot                              - removal bone 5th toe left foot                              - removal screw left 5th  Metatarsal. Dr Paulla Dolly ordered - Demerol 50mg  #30 1 or 2 tablets every 4 to 6 hours prn pain                              - Phenergan 25mg  #30 1 or 2 tablets with the Demerol.

## 2013-05-06 ENCOUNTER — Ambulatory Visit (INDEPENDENT_AMBULATORY_CARE_PROVIDER_SITE_OTHER): Payer: PRIVATE HEALTH INSURANCE | Admitting: Podiatry

## 2013-05-06 ENCOUNTER — Encounter: Payer: Self-pay | Admitting: Podiatry

## 2013-05-06 ENCOUNTER — Ambulatory Visit (INDEPENDENT_AMBULATORY_CARE_PROVIDER_SITE_OTHER): Payer: PRIVATE HEALTH INSURANCE

## 2013-05-06 VITALS — BP 128/77 | HR 74 | Resp 16

## 2013-05-06 DIAGNOSIS — M2040 Other hammer toe(s) (acquired), unspecified foot: Secondary | ICD-10-CM

## 2013-05-06 DIAGNOSIS — M21619 Bunion of unspecified foot: Secondary | ICD-10-CM

## 2013-05-06 NOTE — Progress Notes (Signed)
Subjective:     Patient ID: Marcus Montgomery, male   DOB: 02/11/1945, 68 y.o.   MRN: [id number]  HPI patient states that my foot is feeling better and wearing tennis shoes but I still gets some swelling and discomfort at the end of the day   Review of Systems     Objective:   Physical Exam Neurovascular status intact with well-healing surgical site fifth metatarsal and fifth digit left    Assessment:     X-rays reviewed and patient's doing well with surgery    Plan:     Explained this normal still do have mild edema in the should gradually get better over the next several months but to continue with white her tight shoes and gradually increase activity levels over that.. Patient will be seen back to recheck 6 weeks earlier if any issues should occur

## 2013-06-13 ENCOUNTER — Encounter: Payer: Self-pay | Admitting: Podiatry

## 2013-06-13 ENCOUNTER — Ambulatory Visit (INDEPENDENT_AMBULATORY_CARE_PROVIDER_SITE_OTHER): Payer: PRIVATE HEALTH INSURANCE

## 2013-06-13 ENCOUNTER — Ambulatory Visit (INDEPENDENT_AMBULATORY_CARE_PROVIDER_SITE_OTHER): Payer: PRIVATE HEALTH INSURANCE | Admitting: Podiatry

## 2013-06-13 VITALS — BP 142/67 | HR 87 | Resp 16

## 2013-06-13 DIAGNOSIS — M2040 Other hammer toe(s) (acquired), unspecified foot: Secondary | ICD-10-CM

## 2013-06-13 DIAGNOSIS — M21619 Bunion of unspecified foot: Secondary | ICD-10-CM

## 2013-06-13 NOTE — Progress Notes (Signed)
Subjective:     Patient ID: Marcus Montgomery, male   DOB: [birth year], 68 y.o.   MRN: [id number]  HPI patient states I'm doing well on my left foot with occasional discomfort   Review of Systems     Objective:   Physical Exam    neurovascular status intact with no health history changes note and a well-healing surgical site left with fifth digit and good alignment Assessment:     Doing very well postoperatively    Plan:     Final x-ray reviewed patient instructed to increase activity and discharged and less needs to be seen

## 2013-08-04 ENCOUNTER — Emergency Department (INDEPENDENT_AMBULATORY_CARE_PROVIDER_SITE_OTHER)
Admission: EM | Admit: 2013-08-04 | Discharge: 2013-08-04 | Disposition: A | Discharge status: Home / Self Care | Payer: 59 | Source: Home / Self Care | Attending: Family Medicine | Admitting: Family Medicine

## 2013-08-04 ENCOUNTER — Encounter (HOSPITAL_COMMUNITY): Payer: Self-pay | Admitting: Emergency Medicine

## 2013-08-04 DIAGNOSIS — J40 Bronchitis, not specified as acute or chronic: Secondary | ICD-10-CM

## 2013-08-04 LAB — POCT RAPID STREP A: STREPTOCOCCUS, GROUP A SCREEN (DIRECT): NEGATIVE

## 2013-08-04 MED ORDER — IPRATROPIUM-ALBUTEROL 0.5-2.5 (3) MG/3ML IN SOLN
RESPIRATORY_TRACT | Status: AC
  Filled 2013-08-04: qty 3

## 2013-08-04 MED ORDER — FLUTICASONE PROPIONATE 50 MCG/ACT NA SUSP: 1.0000 | Freq: Two times a day (BID) | NASAL | Status: DC

## 2013-08-04 MED ORDER — AZITHROMYCIN 250 MG PO TABS: ORAL_TABLET | ORAL | Status: DC

## 2013-08-04 MED ORDER — IPRATROPIUM-ALBUTEROL 0.5-2.5 (3) MG/3ML IN SOLN
3.0000 mL | Freq: Once | RESPIRATORY_TRACT | Status: AC
  Administered 2013-08-04: 3 mL via RESPIRATORY_TRACT

## 2013-08-04 MED ORDER — HYDROCOD POLST-CHLORPHEN POLST 10-8 MG/5ML PO LQCR: 5.0000 mL | Freq: Two times a day (BID) | ORAL | Status: DC | PRN

## 2013-08-04 NOTE — ED Notes (Signed)
Patient complains of sore throat that is burning Some cough and congestion -symptoms started this past thursday

## 2013-08-04 NOTE — Discharge Instructions (Signed)
Thank you for coming in today. Call or go to the emergency room if you get worse, have trouble breathing, have chest pains, or palpitations.    Bronchitis Bronchitis is inflammation of the airways that extend from the windpipe into the lungs (bronchi). The inflammation often causes mucus to develop, which leads to a cough. If the inflammation becomes severe, it may cause shortness of breath. CAUSES  Bronchitis may be caused by:   Viral infections.   Bacteria.   Cigarette smoke.   Allergens, pollutants, and other irritants.  SIGNS AND SYMPTOMS  The most common symptom of bronchitis is a frequent cough that produces mucus. Other symptoms include:  Fever.   Body aches.   Chest congestion.   Chills.   Shortness of breath.   Sore throat.  DIAGNOSIS  Bronchitis is usually diagnosed through a medical history and physical exam. Tests, such as chest X-rays, are sometimes done to rule out other conditions.  TREATMENT  You may need to avoid contact with whatever caused the problem (smoking, for example). Medicines are sometimes needed. These may include:  Antibiotics. These may be prescribed if the condition is caused by bacteria.  Cough suppressants. These may be prescribed for relief of cough symptoms.   Inhaled medicines. These may be prescribed to help open your airways and make it easier for you to breathe.   Steroid medicines. These may be prescribed for those with recurrent (chronic) bronchitis. HOME CARE INSTRUCTIONS  Get plenty of rest.   Drink enough fluids to keep your urine clear or pale yellow (unless you have a medical condition that requires fluid restriction). Increasing fluids may help thin your secretions and will prevent dehydration.   Only take over-the-counter or prescription medicines as directed by your health care provider.  Only take antibiotics as directed. Make sure you finish them even if you start to feel better.  Avoid secondhand  smoke, irritating chemicals, and strong fumes. These will make bronchitis worse. If you are a smoker, quit smoking. Consider using nicotine gum or skin patches to help control withdrawal symptoms. Quitting smoking will help your lungs heal faster.   Put a cool-mist humidifier in your bedroom at night to moisten the air. This may help loosen mucus. Change the water in the humidifier daily. You can also run the hot water in your shower and sit in the bathroom with the door closed for 5-10 minutes.   Follow up with your health care provider as directed.   Wash your hands frequently to avoid catching bronchitis again or spreading an infection to others.  SEEK MEDICAL CARE IF: Your symptoms do not improve after 1 week of treatment.  SEEK IMMEDIATE MEDICAL CARE IF:  Your fever increases.  You have chills.   You have chest pain.   You have worsening shortness of breath.   You have bloody sputum.  You faint.  You have lightheadedness.  You have a severe headache.   You vomit repeatedly. MAKE SURE YOU:   Understand these instructions.  Will watch your condition.  Will get help right away if you are not doing well or get worse. Document Released: 01/03/2005 Document Revised: 10/24/2012 Document Reviewed: 08/28/2012 [facility] Patient Information 2015 [place], [place]. This information is not intended to replace advice given to you by your health care provider. Make sure you discuss any questions you have with your health care provider.

## 2013-08-04 NOTE — ED Provider Notes (Signed)
Marcus Montgomery is a 68 y.o. male who presents to Urgent Care today for cough congestion sore throat. Symptoms present for several days and consistent with prior episodes of bronchitis. Patient did well with azithromycin Tussionex and Flonase previously. He denies any fevers or chills nausea vomiting or diarrhea. He denies any trouble breathing.   Past Medical History  Diagnosis Date  . Anemia   . Hepatitis C     as per liver biopsy  . Hyperplastic colon polyp   . Hypertension   . Depression   . Alcoholism   . Leg pain, bilateral 2014   History  Substance Use Topics  . Smoking status: Former Research scientist (life sciences)  . Smokeless tobacco: Never Used  . Alcohol Use: No   ROS as above Medications: No current facility-administered medications for this encounter.   Current Outpatient Prescriptions  Medication Sig Dispense Refill  . acetaminophen (TYLENOL) 325 MG tablet Take 650 mg by mouth continuous as needed for pain.      Marland Kitchen azithromycin (ZITHROMAX Z-PAK) 250 MG tablet Take as directed on pack  6 each  0  . BENICAR HCT 40-12.5 MG per tablet Takes one tablet by mouth once daily      . chlorpheniramine-HYDROcodone (TUSSIONEX PENNKINETIC ER) 10-8 MG/5ML LQCR Take 5 mLs by mouth every 12 (twelve) hours as needed for cough.  200 mL  0  . Ferrous Bisglycinate Chelate POWD 1 tablet by Does not apply route 2 (two) times daily.  1 Bottle  1  . fluticasone (FLONASE) 50 MCG/ACT nasal spray Place 1 spray into both nostrils 2 (two) times daily.  1 g  2    Exam:  BP 179/93  Pulse 70  Temp(Src) 98.3 F (36.8 C) (Oral)  Resp 16  SpO2 99% Gen: Well NAD HEENT: EOMI,  MMM normal tympanic membranes and posterior pharynx Lungs: Normal work of breathing. CTABL Heart: RRR no MRG Abd: NABS, Soft. Nondistended, Nontender Exts: Brisk capillary refill, warm and well perfused.   Patient was given a DuoNeb nebulizer treatment and had no benefit.  Results for orders placed during the hospital encounter of 08/04/13  (from the past 24 hour(s))  POCT RAPID STREP A ([place])     Status: None   Collection Time    08/04/13  1:01 PM      Result Value Ref Range   Streptococcus, Group A Screen (Direct) NEGATIVE  NEGATIVE   No results found.  Assessment and Plan: 68 y.o. male with bronchitis. Plan to treat with azithromycin, Tussionex and Flonase as this has worked well in the past.  Discussed warning signs or symptoms. Please see discharge instructions. Patient expresses understanding.   This note was created using Systems analyst. Any transcription errors are unintended.    Gregor Hams, MD 08/04/13 [phone]

## 2013-08-06 LAB — CULTURE, GROUP A STREP

## 2014-10-17 ENCOUNTER — Encounter (HOSPITAL_COMMUNITY): Payer: Self-pay | Admitting: Emergency Medicine

## 2014-10-17 ENCOUNTER — Emergency Department (INDEPENDENT_AMBULATORY_CARE_PROVIDER_SITE_OTHER)
Admission: EM | Admit: 2014-10-17 | Discharge: 2014-10-17 | Disposition: A | Discharge status: Home / Self Care | Payer: 59 | Source: Home / Self Care | Attending: Family Medicine | Admitting: Family Medicine

## 2014-10-17 DIAGNOSIS — J302 Other seasonal allergic rhinitis: Secondary | ICD-10-CM | POA: Diagnosis not present

## 2014-10-17 MED ORDER — IPRATROPIUM BROMIDE 0.06 % NA SOLN: 2.0000 | Freq: Four times a day (QID) | NASAL | Status: DC

## 2014-10-17 NOTE — ED Provider Notes (Signed)
CSN: [id number]     Arrival date & time 10/17/14  1300 History   First MD Initiated Contact with Patient 10/17/14 1313     Chief Complaint  Patient presents with  . URI   (Consider location/radiation/quality/duration/timing/severity/associated sxs/prior Treatment) HPI Comments: [age]-year-old male presents with a two-week history of runny nose, nasal congestion, sore scratchy throat and chest burning high the upper mid chest. He states his cough is much worse when he is supine particularly at nighttime. A little better during the day. Denies fever, GU or GI symptoms. No abdominal pain. He has been taking Alka-Seltzer plus and NyQuil. Denies chest pain, heaviness, tightness, fullness or pressure. He describes burning in his throat and retro-manubrium.   Past Medical History  Diagnosis Date  . Anemia   . Hepatitis C     as per liver biopsy  . Hyperplastic colon polyp   . Hypertension   . Depression   . Alcoholism   . Leg pain, bilateral 2014   Past Surgical History  Procedure Laterality Date  . Foot surgery     Family History  Problem Relation Age of Onset  . Colon cancer Neg Hx   . Heart disease Father   . Hypertension Father   . Heart attack Father   . Cancer Mother   . Cancer Sister   . Heart disease Brother   . Hypertension Brother   . Heart attack Brother    Social History  Substance Use Topics  . Smoking status: Former Research scientist (life sciences)  . Smokeless tobacco: Never Used  . Alcohol Use: No    Review of Systems  Constitutional: Negative for fever, diaphoresis, activity change and fatigue.  HENT: Positive for congestion, postnasal drip, rhinorrhea and sore throat. Negative for ear pain, facial swelling and trouble swallowing.   Eyes: Negative for pain, discharge and redness.  Respiratory: Positive for cough. Negative for chest tightness and shortness of breath.   Cardiovascular: Negative.   Gastrointestinal: Negative.   Musculoskeletal: Negative.  Negative for neck pain and  neck stiffness.  Neurological: Negative.     Allergies  Demerol  Home Medications   Prior to Admission medications   Medication Sig Start Date End Date Taking? Authorizing Provider  BENICAR HCT 40-12.5 MG per tablet Takes one tablet by mouth once daily 06/02/11  Yes Historical Provider, MD  Multiple Vitamin (MULTIVITAMIN) tablet Take 1 tablet by mouth daily.   Yes Historical Provider, MD  acetaminophen (TYLENOL) 325 MG tablet Take 650 mg by mouth continuous as needed for pain.    Historical Provider, MD  azithromycin (ZITHROMAX Z-PAK) 250 MG tablet Take as directed on pack 08/04/13   Gregor Hams, MD  chlorpheniramine-HYDROcodone [facility] PENNKINETIC ER) 10-8 MG/5ML LQCR Take 5 mLs by mouth every 12 (twelve) hours as needed for cough. 08/04/13   Gregor Hams, MD  Ferrous Bisglycinate Chelate POWD 1 tablet by Does not apply route 2 (two) times daily. 09/21/11   Lafayette Dragon, MD  fluticasone (FLONASE) 50 MCG/ACT nasal spray Place 1 spray into both nostrils 2 (two) times daily. 08/04/13   Gregor Hams, MD  ipratropium (ATROVENT) 0.06 % nasal spray Place 2 sprays into both nostrils 4 (four) times daily. 10/17/14   Janne Napoleon, NP   Meds Ordered and Administered this Visit  Medications - No data to display  BP 149/79 mmHg  Pulse 75  Temp(Src) 98.5 F (36.9 C) (Oral)  Resp 18  SpO2 100% No data found.   Physical Exam  Constitutional: He  is oriented to person, place, and time. He appears well-developed and well-nourished. No distress.  HENT:  Mouth/Throat: No oropharyngeal exudate.  Bilateral TMs are normal Oropharynx with minor streaky erythema and scant clear PND. No exudates  Eyes: Conjunctivae and EOM are normal.  Neck: Normal range of motion. Neck supple.  Cardiovascular: Normal rate, regular rhythm and normal heart sounds.   Pulmonary/Chest: Effort normal and breath sounds normal. No respiratory distress. He has no wheezes. He has no rales.  Musculoskeletal: Normal range of  motion. He exhibits no edema.  Lymphadenopathy:    He has no cervical adenopathy.  Neurological: He is alert and oriented to person, place, and time.  Skin: Skin is warm and dry. No rash noted.  Psychiatric: He has a normal mood and affect.  Nursing note and vitals reviewed.   ED Course  Procedures (including critical care time)  Labs Review Labs Reviewed - No data to display  Imaging Review No results found.   Visual Acuity Review  Right Eye Distance:   Left Eye Distance:   Bilateral Distance:    Right Eye Near:   Left Eye Near:    Bilateral Near:         MDM   1. Other seasonal allergic rhinitis    No signs or symptoms or source identified of bacterial etiology. We will treat typical allergy symptoms as below.  Continue with Flonase nasal spray May use Allegra or Zyrtec daily for drainage and allergy symptoms Atrovent nasal spray as directed Drink plenty of fluids and stay well-hydrated   Janne Napoleon, NP 10/17/14 1339

## 2014-10-17 NOTE — ED Notes (Signed)
C/o cold sx onset 2 weeks  Sx include dry cough, sneezing, congestion, ST and chest pain  Denies fevers, chills Alert and oriented x4... No acute distress.

## 2014-10-17 NOTE — Discharge Instructions (Signed)
Allergic Rhinitis Continue with Flonase nasal spray May use Allegra or Zyrtec daily for drainage and allergy symptoms Atrovent nasal spray as directed Drink plenty of fluids and stay well-hydrated Allergic rhinitis is when the mucous membranes in the nose respond to allergens. Allergens are particles in the air that cause your body to have an allergic reaction. This causes you to release allergic antibodies. Through a chain of events, these eventually cause you to release histamine into the blood stream. Although meant to protect the body, it is this release of histamine that causes your discomfort, such as frequent sneezing, congestion, and an itchy, runny nose.  CAUSES  Seasonal allergic rhinitis (hay fever) is caused by pollen allergens that may come from grasses, trees, and weeds. Year-round allergic rhinitis (perennial allergic rhinitis) is caused by allergens such as house dust mites, pet dander, and mold spores.  SYMPTOMS   Nasal stuffiness (congestion).  Itchy, runny nose with sneezing and tearing of the eyes. DIAGNOSIS  Your health care provider can help you determine the allergen or allergens that trigger your symptoms. If you and your health care provider are unable to determine the allergen, skin or blood testing may be used. TREATMENT  Allergic rhinitis does not have a cure, but it can be controlled by:  Medicines and allergy shots (immunotherapy).  Avoiding the allergen. Hay fever may often be treated with antihistamines in pill or nasal spray forms. Antihistamines block the effects of histamine. There are over-the-counter medicines that may help with nasal congestion and swelling around the eyes. Check with your health care provider before taking or giving this medicine.  If avoiding the allergen or the medicine prescribed do not work, there are many new medicines your health care provider can prescribe. Stronger medicine may be used if initial measures are ineffective.  Desensitizing injections can be used if medicine and avoidance does not work. Desensitization is when a patient is given ongoing shots until the body becomes less sensitive to the allergen. Make sure you follow up with your health care provider if problems continue. HOME CARE INSTRUCTIONS It is not possible to completely avoid allergens, but you can reduce your symptoms by taking steps to limit your exposure to them. It helps to know exactly what you are allergic to so that you can avoid your specific triggers. SEEK MEDICAL CARE IF:   You have a fever.  You develop a cough that does not stop easily (persistent).  You have shortness of breath.  You start wheezing.  Symptoms interfere with normal daily activities. Document Released: 09/28/2000 Document Revised: 01/08/2013 Document Reviewed: 09/10/2012 [facility] Patient Information 2015 [place], [place]. This information is not intended to replace advice given to you by your health care provider. Make sure you discuss any questions you have with your health care provider.

## 2015-01-14 ENCOUNTER — Ambulatory Visit (INDEPENDENT_AMBULATORY_CARE_PROVIDER_SITE_OTHER): Payer: PRIVATE HEALTH INSURANCE | Admitting: Family Medicine

## 2015-01-14 VITALS — BP 142/78 | HR 101 | Temp 98.5°F | Resp 16 | Ht 70.0 in | Wt 177.4 lb

## 2015-01-14 DIAGNOSIS — J329 Chronic sinusitis, unspecified: Secondary | ICD-10-CM | POA: Diagnosis not present

## 2015-01-14 DIAGNOSIS — J069 Acute upper respiratory infection, unspecified: Secondary | ICD-10-CM

## 2015-01-14 DIAGNOSIS — R05 Cough: Secondary | ICD-10-CM

## 2015-01-14 DIAGNOSIS — J310 Chronic rhinitis: Secondary | ICD-10-CM

## 2015-01-14 DIAGNOSIS — R059 Cough, unspecified: Secondary | ICD-10-CM

## 2015-01-14 MED ORDER — IPRATROPIUM BROMIDE 0.03 % NA SOLN: 2.0000 | Freq: Two times a day (BID) | NASAL | Status: DC

## 2015-01-14 MED ORDER — HYDROCODONE-HOMATROPINE 5-1.5 MG/5ML PO SYRP: 5.0000 mL | ORAL_SOLUTION | ORAL | Status: DC | PRN

## 2015-01-14 MED ORDER — AMOXICILLIN 875 MG PO TABS: 875.0000 mg | ORAL_TABLET | Freq: Two times a day (BID) | ORAL | Status: DC

## 2015-01-14 MED ORDER — BENZONATATE 100 MG PO CAPS: ORAL_CAPSULE | ORAL | Status: DC

## 2015-01-14 NOTE — Progress Notes (Addendum)
Patient ID: Marcus Montgomery, male    DOB: [birth year]  Age: 69 y.o. MRN: [id number]  Chief Complaint  Patient presents with  . Sinusitis    x 1 week  . Cough    x 1 week  . Sore Throat    x 1 week  . Generalized Body Aches    x 1week  . Chills    Subjective:   69 year old man who is retired. He says he has had more respiratory infections this year than usual. For about a week he has had an upper respiratory infection with runny nose and coughing. The cough is keeping him awake. He has been having chills but no documented fevers. He did have his flu shot as he does every year. He does not smoke. His wife is not ill. He has been blowing yellow and light green mucus from his nose and coughing up some more. He has chills.  Current allergies, medications, problem list, past/family and social histories reviewed.  Objective:  BP 142/78 mmHg  Pulse 101  Temp(Src) 98.5 F (36.9 C) (Oral)  Resp 16  Ht 5\' 10"  (1.778 m)  Wt 177 lb 6.4 oz (80.468 kg)  BMI 25.45 kg/m2  SpO2 98%  No major acute distress. Talks a little nasally because of pain congestion. His TMs are normal. Nose congested. Throat clear, mild uvula edema. Without significant nodes. Chest is clear to auscultation. No rhonchi, rales, or wheezes. Heart regular without murmurs.  Assessment & Plan:   Assessment: 1. Cough   2. Acute upper respiratory infection   3. Rhinosinusitis       Plan: Prolonged upper a story infection, probably viral in origin, could be transitioning to bacterial sinusitis. Will go ahead and treat cyst is been week.  No orders of the defined types were placed in this encounter.    Meds ordered this encounter  Medications  . amoxicillin (AMOXIL) 875 MG tablet    Sig: Take 1 tablet (875 mg total) by mouth 2 (two) times daily.    Dispense:  20 tablet    Refill:  0  . ipratropium (ATROVENT) 0.03 % nasal spray    Sig: Place 2 sprays into both nostrils 2 (two) times daily.    Dispense:  30 mL   Refill:  0  . benzonatate (TESSALON PERLES) 100 MG capsule    Sig: Take 1-2 pills 3 times daily as needed for daytime cough    Dispense:  20 capsule    Refill:  0  . DISCONTD: HYDROcodone-homatropine (HYCODAN) 5-1.5 MG/5ML syrup    Sig: Take 5 mLs by mouth every 4 (four) hours as needed.    Dispense:  120 mL    Refill:  0         Patient Instructions  Drink plenty of fluids and try to get more rest than usual  Take the benzonatate cough pills one or 2 pills 3 times daily as needed for daytime cough  Take the Hycodan cough syrup 1 teaspoon every 4-6 hours as needed for nighttime cough. May cause drowsiness.  Take the amoxicillin one twice daily for infection  Use the Atrovent ipratropium nasal spray 2 sprays in each nostril 3 or 4 times daily as needed for head congestion and drainage  Return if high improving     Return if symptoms worsen or fail to improve.   [name], MD 01/19/2015

## 2015-01-14 NOTE — Patient Instructions (Signed)
Drink plenty of fluids and try to get more rest than usual  Take the benzonatate cough pills one or 2 pills 3 times daily as needed for daytime cough  Take the Hycodan cough syrup 1 teaspoon every 4-6 hours as needed for nighttime cough. May cause drowsiness.  Take the amoxicillin one twice daily for infection  Use the Atrovent ipratropium nasal spray 2 sprays in each nostril 3 or 4 times daily as needed for head congestion and drainage  Return if high improving

## 2015-01-18 ENCOUNTER — Telehealth: Payer: Self-pay

## 2015-01-18 ENCOUNTER — Ambulatory Visit (INDEPENDENT_AMBULATORY_CARE_PROVIDER_SITE_OTHER): Payer: PRIVATE HEALTH INSURANCE | Admitting: Internal Medicine

## 2015-01-18 VITALS — BP 136/70 | HR 107 | Temp 98.0°F | Resp 16 | Ht 70.5 in | Wt 177.0 lb

## 2015-01-18 DIAGNOSIS — J069 Acute upper respiratory infection, unspecified: Secondary | ICD-10-CM | POA: Diagnosis not present

## 2015-01-18 DIAGNOSIS — R059 Cough, unspecified: Secondary | ICD-10-CM

## 2015-01-18 DIAGNOSIS — R05 Cough: Secondary | ICD-10-CM

## 2015-01-18 MED ORDER — HYDROCODONE-HOMATROPINE 5-1.5 MG/5ML PO SYRP: 5.0000 mL | ORAL_SOLUTION | Freq: Four times a day (QID) | ORAL | Status: DC | PRN

## 2015-01-18 NOTE — Progress Notes (Signed)
Subjective:  By signing my name below, I, Moises Blood, attest that this documentation has been prepared under the direction and in the presence of Tami Lin, MD. Electronically Signed: Moises Blood, [place]. 01/18/2015 , 3:55 PM .  Patient was seen in Room 1 .   Patient ID: Marcus Montgomery, male    DOB: 1945/06/17, 70 y.o.   MRN: [id number] Chief Complaint  Patient presents with  . Follow-up   HPI Marcus Montgomery is a 70 y.o. male who presents to [facility] for follow up on URI. He was seen in the office 4 days ago by Dr. Linna Darner. He was prescribed amoxicillin, atrovent, tessalon perles and hycodan. He states that he still has some irritation and cough. He denies history of asthma. He denies going to the [facility] this past week with the illness. He denies shortness of breath and wheezing prior to this illness.   He usually goes to the [facility] for exercise regularly.   Patient Active Problem List   Diagnosis Date Noted  . Numbness-Bilateral leg 06/21/2012  . Weakness-Bilateral leg 06/21/2012  . Pain in limb-Bilateral leg 06/21/2012  . Peripheral vascular disease, unspecified ([place]) 06/21/2012    Current outpatient prescriptions:  .  acetaminophen (TYLENOL) 325 MG tablet, Take 650 mg by mouth continuous as needed for pain. Reported on 01/14/2015, Disp: , Rfl:  .  amoxicillin (AMOXIL) 875 MG tablet, Take 1 tablet (875 mg total) by mouth 2 (two) times daily., Disp: 20 tablet, Rfl: 0 .  BENICAR HCT 40-12.5 MG per tablet, Takes one tablet by mouth once daily, Disp: , Rfl:  .  Ferrous Bisglycinate Chelate POWD, 1 tablet by Does not apply route 2 (two) times daily., Disp: 1 Bottle, Rfl: 1 .  HYDROcodone-homatropine (HYCODAN) 5-1.5 MG/5ML syrup, Take 5 mLs by mouth every 4 (four) hours as needed., Disp: 120 mL, Rfl: 0 .  ipratropium (ATROVENT) 0.03 % nasal spray, Place 2 sprays into both nostrils 2 (two) times daily., Disp: 30 mL, Rfl: 0 .  Multiple Vitamin (MULTIVITAMIN) tablet, Take 1 tablet by  mouth daily., Disp: , Rfl:  .  azithromycin (ZITHROMAX Z-PAK) 250 MG tablet, Take as directed on pack, Disp: 6 each, Rfl: 0 .  benzonatate (TESSALON PERLES) 100 MG capsule, Take 1-2 pills 3 times daily as needed for daytime cough (Patient not taking: Reported on 01/18/2015), Disp: 20 capsule, Rfl: 0 .  chlorpheniramine-HYDROcodone (TUSSIONEX PENNKINETIC ER) 10-8 MG/5ML LQCR, Take 5 mLs by mouth every 12 (twelve) hours as needed for cough. (Patient not taking: Reported on 01/18/2015), Disp: 200 mL, Rfl: 0  Review of Systems  Constitutional: Negative for fever, chills and fatigue.  HENT: Positive for sore throat. Negative for congestion, rhinorrhea and sneezing.   Respiratory: Positive for cough. Negative for chest tightness, shortness of breath and wheezing.   Gastrointestinal: Negative for nausea, vomiting and abdominal pain.      Objective:   Physical Exam  Constitutional: He is oriented to person, place, and time. He appears well-developed and well-nourished. No distress.  HENT:  Head: Normocephalic and atraumatic.  Eyes: EOM are normal. Pupils are equal, round, and reactive to light.  Neck: Neck supple.  Cardiovascular: Normal rate.   Pulmonary/Chest: Effort normal. No respiratory distress.  Musculoskeletal: Normal range of motion.  Neurological: He is alert and oriented to person, place, and time.  Skin: Skin is warm and dry.  Psychiatric: He has a normal mood and affect. His behavior is normal.  Nursing note and vitals reviewed.   BP  136/70 mmHg  Pulse 107  Temp(Src) 98 F (36.7 C) (Oral)  Resp 16  Ht 5' 10.5" (1.791 m)  Wt 177 lb (80.287 kg)  BMI 25.03 kg/m2  SpO2 97%     Assessment & Plan:  I have completed the patient encounter in its entirety as documented by the scribe, with editing by me where necessary. Sheila Ocasio P. Laney Pastor, M.D.  Cough - Plan: HYDROcodone-homatropine (HYCODAN) 5-1.5 MG/5ML syrup  Acute upper respiratory infection  Contin meds per Dr Linna Darner Meds  ordered this encounter  Medications  . HYDROcodone-homatropine (HYCODAN) 5-1.5 MG/5ML syrup    Sig: Take 5 mLs by mouth every 6 (six) hours as needed.    Dispense:  240 mL    Refill:  0

## 2015-01-18 NOTE — Telephone Encounter (Signed)
PATIENT STATES HE SAW DR. HOPPER ON Wednesday FOR A COLD AND COUGH. HE WAS GIVEN HYDROCODONE HOMATROPINE SYRUP. HE WOULD LIKE TO GET A REFILL ON IT. AFTER TODAY HE WILL ALMOST BE OUT. HE IS ABOUT THE SAME WITH THE SORE THROAT AND COUGH. IT'S ALSO HARD FOR HIM TO TALK. BEST PHONE [phone]  [place]   [place]

## 2015-01-19 MED ORDER — HYDROCODONE-HOMATROPINE 5-1.5 MG/5ML PO SYRP: 5.0000 mL | ORAL_SOLUTION | Freq: Four times a day (QID) | ORAL | Status: DC | PRN

## 2015-01-19 NOTE — Telephone Encounter (Signed)
Okay to refill the hycodan once.  I will be at the office later and can sign it.  I cannot print it from home.

## 2015-01-19 NOTE — Telephone Encounter (Signed)
Rx printed. Asked Marcus Montgomery if she would give this to you.

## 2015-01-20 NOTE — Telephone Encounter (Signed)
Called pt to advise Rx is ready and he stated that he came back into the office on 1/1, and was given a RF then, so he does not need this RF. I will shred the one written on 1/2.

## 2015-01-20 NOTE — Telephone Encounter (Signed)
Thank you :)

## 2015-05-22 ENCOUNTER — Encounter: Payer: Self-pay | Admitting: Internal Medicine

## 2015-06-18 HISTORY — PX: CARDIAC CATHETERIZATION: SHX172

## 2015-07-02 ENCOUNTER — Encounter: Payer: Self-pay | Admitting: Gastroenterology

## 2015-07-20 ENCOUNTER — Telehealth: Payer: Self-pay | Admitting: Internal Medicine

## 2015-07-20 NOTE — Telephone Encounter (Signed)
Received records from Dept of [facility] for appointment with Dr Debara Pickett 08/26/15.  Records given to [facility] (medical records) for Dr Lysbeth Penner schedule on 08/26/15.

## 2015-07-23 ENCOUNTER — Encounter: Payer: Self-pay | Admitting: Cardiovascular Disease

## 2015-07-23 ENCOUNTER — Ambulatory Visit (INDEPENDENT_AMBULATORY_CARE_PROVIDER_SITE_OTHER): Payer: 59 | Admitting: Cardiovascular Disease

## 2015-07-23 VITALS — BP 116/70 | HR 59 | Ht 69.0 in | Wt 178.0 lb

## 2015-07-23 DIAGNOSIS — N183 Chronic kidney disease, stage 3 unspecified: Secondary | ICD-10-CM

## 2015-07-23 DIAGNOSIS — I25118 Atherosclerotic heart disease of native coronary artery with other forms of angina pectoris: Secondary | ICD-10-CM

## 2015-07-23 DIAGNOSIS — E785 Hyperlipidemia, unspecified: Secondary | ICD-10-CM | POA: Diagnosis not present

## 2015-07-23 DIAGNOSIS — R9439 Abnormal result of other cardiovascular function study: Secondary | ICD-10-CM

## 2015-07-23 DIAGNOSIS — R931 Abnormal findings on diagnostic imaging of heart and coronary circulation: Secondary | ICD-10-CM | POA: Diagnosis not present

## 2015-07-23 NOTE — Patient Instructions (Signed)
Your physician has requested that you have a cardiac catheterization on Tuesday, July 11th, with either Dr Martinique or Dr Claiborne Billings. Cardiac catheterization is used to diagnose and/or treat various heart conditions. Doctors may recommend this procedure for a number of different reasons. The most common reason is to evaluate chest pain. Chest pain can be a symptom of coronary artery disease (CAD), and cardiac catheterization can show whether plaque is narrowing or blocking your heart's arteries. This procedure is also used to evaluate the valves, as well as measure the blood flow and oxygen levels in different parts of your heart. For further information please visit [facility]uge[facility]iest[facility][email or web address].   Following your catheterization, you will not be allowed to drive for 3 days. No lifting, pushing, or pulling greater that 10 pounds is allowed for 1 week.  **Procedure should be performed in the afternoon. Patient should be admitted that morning (at least 6 hours prior to procedure) for hydration.

## 2015-07-23 NOTE — Progress Notes (Signed)
Cardiology Office Note    Date:  07/24/2015   ID:  WYNDELL LEPINE, DOB Apr 15, 1945, MRN [id number]  PCP:  Patricia Nettle, MD  Cardiologist:   Sanda Klein, MD   Chief Complaint  Patient presents with  . New Evaluation    pt states he needs a stent    History of Present Illness:  Marcus Montgomery is a 70 y.o. male referred from the [facility] for percutaneous revascularization.   He presented with complaints of functional class II exertional dyspnea (less clear inconsistent mild exertional chest tightness) and underwent a nuclear stress test on June 9. This was an equivocal study, with mildly reversible defects in the mid to apical inferolateral and anteroseptal walls. The ejection fraction was 74%. He subsequently underwent cardiac catheterization and this showed the presence of an 80% mid RCA stenosis. Additional moderate atherosclerotic lesions were seen in the left coronary system (distal left main 20%, proximal LAD 50%, mid LAD 40%, circumflex 20%). Only 40 mL of Omnipaque were used for the study since he has moderate chronic kidney disease. Left ventricular systolic function was not evaluated by angiography. Left ventricular end-diastolic pressure was 16 mmHg. He was started on clopidogrel and referred for percutaneous revascularization to our institution.  Significant comorbid conditions include hypertension, hyperlipidemia chronic hepatitis C, posttraumatic stress disorder, gastroesophageal reflux disease, and most importantly chronic kidney disease. His creatinine was 2.08 on June 23, improved to 1.7 on June 26: his GFR is roughly 50 mL/minute.  Family history is significant for coronary artery disease, albeit not particularly premature ages (father 12 years old, brother 59 years old).  Past Medical History  Diagnosis Date  . Anemia   . Hepatitis C     as per liver biopsy  . Hyperplastic colon polyp   . Hypertension   . Depression   . Alcoholism ([place])   .  Leg pain, bilateral 2014    Past Surgical History  Procedure Laterality Date  . Foot surgery      Current Medications: Outpatient Prescriptions Prior to Visit  Medication Sig Dispense Refill  . BENICAR HCT 40-12.5 MG per tablet Takes one tablet by mouth once daily    . Ferrous Bisglycinate Chelate POWD 1 tablet by Does not apply route 2 (two) times daily. 1 Bottle 1  . HYDROcodone-homatropine (HYCODAN) 5-1.5 MG/5ML syrup Take 5 mLs by mouth every 6 (six) hours as needed. 240 mL 0  . ipratropium (ATROVENT) 0.03 % nasal spray Place 2 sprays into both nostrils 2 (two) times daily. 30 mL 0  . Multiple Vitamin (MULTIVITAMIN) tablet Take 1 tablet by mouth daily.    Marland Kitchen acetaminophen (TYLENOL) 325 MG tablet Take 650 mg by mouth continuous as needed for pain. Reported on 01/14/2015    . amoxicillin (AMOXIL) 875 MG tablet Take 1 tablet (875 mg total) by mouth 2 (two) times daily. 20 tablet 0  . azithromycin (ZITHROMAX Z-PAK) 250 MG tablet Take as directed on pack 6 each 0  . benzonatate (TESSALON PERLES) 100 MG capsule Take 1-2 pills 3 times daily as needed for daytime cough (Patient not taking: Reported on 07/23/2015) 20 capsule 0   No facility-administered medications prior to visit.     Allergies:   Demerol   Social History   Social History  . Marital Status: Married    Spouse Name: N/A  . Number of Children: 2  . Years of Education: N/A   Occupational History  . Retired    Social History  Main Topics  . Smoking status: Former Research scientist (life sciences)  . Smokeless tobacco: Never Used  . Alcohol Use: No  . Drug Use: No  . Sexual Activity: Not Asked   Other Topics Concern  . None   Social History Narrative     Family History:  The patient's family history includes Cancer in his mother and sister; Heart attack in his brother and father; Heart disease in his brother and father; Hypertension in his brother and father. There is no history of Colon cancer.   ROS:   Please see the history of present  illness.  He has chronic back pain and limb numbness consistent with sciatica.  ROS All other systems reviewed and are negative.   PHYSICAL EXAM:   VS:  BP 116/70 mmHg  Pulse 59  Ht 5\' 9"  (1.753 m)  Wt 80.74 kg (178 lb)  BMI 26.27 kg/m2  SpO2 97%   GEN: Well nourished, well developed, in no acute distress HEENT: normal Neck: no JVD, carotid bruits, or masses Cardiac: RRR; no murmurs, rubs, or gallops,no edema  Respiratory:  clear to auscultation bilaterally, normal work of breathing GI: soft, nontender, nondistended, + BS MS: no deformity or atrophy Skin: warm and dry, no rash Neuro:  Alert and Oriented x 3, Strength and sensation are intact Psych: euthymic mood, full affect  Wt Readings from Last 3 Encounters:  07/23/15 80.74 kg (178 lb)  01/18/15 80.287 kg (177 lb)  01/14/15 80.468 kg (177 lb 6.4 oz)      Studies/Labs Reviewed:   EKG:  EKG is ordered today.  The ekg ordered today demonstrates Mild sinus bradycardia, otherwise normal. QTC 372 ms  Recent Labs: June 26 creatinine 1.7, glucose 102, potassium 4.5, BUN 30, hemoglobin 13.1, platelets 300 There is no lipid profile available for review Additional studies/ records that were reviewed today include:  Coronary angiogram images, which the patient had on a CD    ASSESSMENT:    1. Coronary artery disease involving native coronary artery of native heart with other form of angina pectoris ([place])   2. Abnormal nuclear stress test   3. Hyperlipidemia   4. CKD (chronic kidney disease) stage 3, GFR 30-59 ml/min      PLAN:  In order of problems listed above:  1. CAD: Marcus Montgomery has a symptomatic mid right coronary artery significant stenosis of 80%. We discussed the risks and benefits of percutaneous revascularization and stent placement. His highest risk is that of acute renal nephrotoxicity related to contrast use. We will admit him a few hours earlier for intravenous fluids and try to keep the contrast dose to a  minimum. Small risk of acute artery obstruction and need for emergency bypass surgery and other serious complication was also reviewed. Plan to perform the procedure via the radial approach which should minimize the bleeding risk. He is already on dual antiplatelet therapy, statin and beta blocker 2. The nuclear perfusion images are not available for review and the study is rather equivocal. It is possible that the inferolateral defect is related to the right coronary abnormality. No clear etiology for the apical anteroseptal defect, although he does have some diffuse LAD disease. 3. HLP: He is on a fairly high-dose of statin, but his most recent lipid profile is not available for review. 4. CKD: As above. Will administer IV fluids and really evaluate his renal function on the day of the coronary procedure.    Medication Adjustments/Labs and Tests Ordered: Current medicines are reviewed at length with  the patient today.  Concerns regarding medicines are outlined above.  Medication changes, Labs and Tests ordered today are listed in the Patient Instructions below. Patient Instructions  Your physician has requested that you have a cardiac catheterization on Tuesday, July 11th, with either Dr Martinique or Dr Claiborne Billings. Cardiac catheterization is used to diagnose and/or treat various heart conditions. Doctors may recommend this procedure for a number of different reasons. The most common reason is to evaluate chest pain. Chest pain can be a symptom of coronary artery disease (CAD), and cardiac catheterization can show whether plaque is narrowing or blocking your heart's arteries. This procedure is also used to evaluate the valves, as well as measure the blood flow and oxygen levels in different parts of your heart. For further information please visit HugeFiesta[facility][email or web address].   Following your catheterization, you will not be allowed to drive for 3 days. No lifting, pushing, or pulling greater that 10 pounds is allowed  for 1 week.  **Procedure should be performed in the afternoon. Patient should be admitted that morning (at least 6 hours prior to procedure) for hydration.     Signed, Sanda Klein, MD  07/24/2015 9:30 PM    Marcus Montgomery, [place], [place]  [address] Phone: [phone]; Fax: [phone]

## 2015-07-24 ENCOUNTER — Other Ambulatory Visit: Payer: Self-pay | Admitting: Cardiovascular Disease

## 2015-07-24 DIAGNOSIS — E785 Hyperlipidemia, unspecified: Secondary | ICD-10-CM | POA: Insufficient documentation

## 2015-07-24 DIAGNOSIS — R9439 Abnormal result of other cardiovascular function study: Secondary | ICD-10-CM

## 2015-07-24 DIAGNOSIS — N183 Chronic kidney disease, stage 3 unspecified: Secondary | ICD-10-CM | POA: Insufficient documentation

## 2015-07-24 DIAGNOSIS — I2510 Atherosclerotic heart disease of native coronary artery without angina pectoris: Secondary | ICD-10-CM | POA: Insufficient documentation

## 2015-07-28 ENCOUNTER — Encounter (HOSPITAL_COMMUNITY): Payer: Self-pay | Admitting: General Practice

## 2015-07-28 ENCOUNTER — Encounter (HOSPITAL_COMMUNITY)
Admission: RE | Disposition: A | Discharge status: Home / Self Care | Payer: Self-pay | Source: Ambulatory Visit | Attending: Cardiovascular Disease

## 2015-07-28 ENCOUNTER — Ambulatory Visit (HOSPITAL_COMMUNITY)
Admission: RE | Admit: 2015-07-28 | Discharge: 2015-07-29 | Disposition: A | Discharge status: Home / Self Care | Payer: Non-veteran care | Source: Ambulatory Visit | Attending: Cardiovascular Disease | Admitting: Cardiovascular Disease

## 2015-07-28 DIAGNOSIS — R9439 Abnormal result of other cardiovascular function study: Secondary | ICD-10-CM | POA: Diagnosis not present

## 2015-07-28 DIAGNOSIS — F329 Major depressive disorder, single episode, unspecified: Secondary | ICD-10-CM | POA: Insufficient documentation

## 2015-07-28 DIAGNOSIS — K219 Gastro-esophageal reflux disease without esophagitis: Secondary | ICD-10-CM | POA: Insufficient documentation

## 2015-07-28 DIAGNOSIS — G8929 Other chronic pain: Secondary | ICD-10-CM | POA: Insufficient documentation

## 2015-07-28 DIAGNOSIS — I2510 Atherosclerotic heart disease of native coronary artery without angina pectoris: Secondary | ICD-10-CM | POA: Diagnosis present

## 2015-07-28 DIAGNOSIS — B182 Chronic viral hepatitis C: Secondary | ICD-10-CM | POA: Insufficient documentation

## 2015-07-28 DIAGNOSIS — I10 Essential (primary) hypertension: Secondary | ICD-10-CM

## 2015-07-28 DIAGNOSIS — F4310 Post-traumatic stress disorder, unspecified: Secondary | ICD-10-CM | POA: Insufficient documentation

## 2015-07-28 DIAGNOSIS — I129 Hypertensive chronic kidney disease with stage 1 through stage 4 chronic kidney disease, or unspecified chronic kidney disease: Secondary | ICD-10-CM | POA: Diagnosis not present

## 2015-07-28 DIAGNOSIS — Z955 Presence of coronary angioplasty implant and graft: Secondary | ICD-10-CM

## 2015-07-28 DIAGNOSIS — Z87891 Personal history of nicotine dependence: Secondary | ICD-10-CM | POA: Diagnosis not present

## 2015-07-28 DIAGNOSIS — N183 Chronic kidney disease, stage 3 unspecified: Secondary | ICD-10-CM | POA: Diagnosis present

## 2015-07-28 DIAGNOSIS — D649 Anemia, unspecified: Secondary | ICD-10-CM | POA: Diagnosis not present

## 2015-07-28 DIAGNOSIS — M549 Dorsalgia, unspecified: Secondary | ICD-10-CM | POA: Insufficient documentation

## 2015-07-28 DIAGNOSIS — I25110 Atherosclerotic heart disease of native coronary artery with unstable angina pectoris: Secondary | ICD-10-CM | POA: Insufficient documentation

## 2015-07-28 DIAGNOSIS — I200 Unstable angina: Secondary | ICD-10-CM

## 2015-07-28 DIAGNOSIS — Z8249 Family history of ischemic heart disease and other diseases of the circulatory system: Secondary | ICD-10-CM | POA: Insufficient documentation

## 2015-07-28 DIAGNOSIS — E785 Hyperlipidemia, unspecified: Secondary | ICD-10-CM | POA: Diagnosis not present

## 2015-07-28 HISTORY — DX: Pure hypercholesterolemia, unspecified: E78.00

## 2015-07-28 HISTORY — PX: CORONARY ANGIOPLASTY WITH STENT PLACEMENT: SHX49

## 2015-07-28 HISTORY — DX: Low back pain: M54.5

## 2015-07-28 HISTORY — DX: Low back pain, unspecified: M54.50

## 2015-07-28 HISTORY — DX: Pneumonia, unspecified organism: J18.9

## 2015-07-28 HISTORY — DX: Other chronic pain: G89.29

## 2015-07-28 HISTORY — PX: CARDIAC CATHETERIZATION: SHX172

## 2015-07-28 HISTORY — DX: Personal history of other diseases of the digestive system: Z87.19

## 2015-07-28 HISTORY — DX: Chronic kidney disease, stage 3 unspecified: N18.30

## 2015-07-28 HISTORY — DX: Atherosclerotic heart disease of native coronary artery without angina pectoris: I25.10

## 2015-07-28 HISTORY — DX: Chronic kidney disease, stage 3 (moderate): N18.3

## 2015-07-28 HISTORY — DX: Gastro-esophageal reflux disease without esophagitis: K21.9

## 2015-07-28 LAB — BASIC METABOLIC PANEL
Anion gap: 8 (ref 5–15)
BUN: 24 mg/dL — AB (ref 6–20)
CO2: 25 mmol/L (ref 22–32)
CREATININE: 1.82 mg/dL — AB (ref 0.61–1.24)
Calcium: 9.3 mg/dL (ref 8.9–10.3)
Chloride: 109 mmol/L (ref 101–111)
GFR, EST AFRICAN AMERICAN: 43 mL/min — AB (ref >60)
GFR, EST NON AFRICAN AMERICAN: 37 mL/min — AB (ref >60)
GLUCOSE: 112 mg/dL — AB (ref 65–99)
Potassium: 4.3 mmol/L (ref 3.5–5.1)
Sodium: 142 mmol/L (ref 135–145)

## 2015-07-28 LAB — CBC
HEMATOCRIT: 36.2 % — AB (ref 39.0–52.0)
Hemoglobin: 12.3 g/dL — ABNORMAL LOW (ref 13.0–17.0)
MCH: 27.8 pg (ref 26.0–34.0)
MCHC: 34 g/dL (ref 30.0–36.0)
MCV: 81.9 fL (ref 78.0–100.0)
Platelets: 155 10*3/uL (ref 150–400)
RBC: 4.42 MIL/uL (ref 4.22–5.81)
RDW: 12.7 % (ref 11.5–15.5)
WBC: 6.2 10*3/uL (ref 4.0–10.5)

## 2015-07-28 LAB — PROTIME-INR
INR: 1.12 (ref 0.00–1.49)
Prothrombin Time: 14.6 seconds (ref 11.6–15.2)

## 2015-07-28 LAB — POCT ACTIVATED CLOTTING TIME: ACTIVATED CLOTTING TIME: 461 s

## 2015-07-28 LAB — APTT: APTT: 33 s (ref 24–37)

## 2015-07-28 SURGERY — CORONARY STENT INTERVENTION

## 2015-07-28 MED ORDER — OLMESARTAN MEDOXOMIL-HCTZ 40-12.5 MG PO TABS: 1.0000 | ORAL_TABLET | Freq: Every day | ORAL | Status: DC

## 2015-07-28 MED ORDER — SODIUM CHLORIDE 0.9 % IV SOLN: 250.0000 mL | INTRAVENOUS | Status: DC | PRN

## 2015-07-28 MED ORDER — FENTANYL CITRATE (PF) 100 MCG/2ML IJ SOLN
INTRAMUSCULAR | Status: DC | PRN
  Administered 2015-07-28: 50 ug via INTRAVENOUS
  Administered 2015-07-28 (×2): 25 ug via INTRAVENOUS

## 2015-07-28 MED ORDER — SODIUM CHLORIDE 0.9 % WEIGHT BASED INFUSION
3.0000 mL/kg/h | INTRAVENOUS | Status: DC
  Administered 2015-07-28: 3 mL/kg/h via INTRAVENOUS

## 2015-07-28 MED ORDER — MIDAZOLAM HCL 2 MG/2ML IJ SOLN
INTRAMUSCULAR | Status: DC | PRN
  Administered 2015-07-28: 2 mg via INTRAVENOUS
  Administered 2015-07-28: 1 mg via INTRAVENOUS

## 2015-07-28 MED ORDER — ADULT MULTIVITAMIN W/MINERALS CH
1.0000 | ORAL_TABLET | Freq: Every day | ORAL | Status: DC
  Administered 2015-07-28 – 2015-07-29 (×2): 1 via ORAL
  Filled 2015-07-28 (×2): qty 1

## 2015-07-28 MED ORDER — SODIUM CHLORIDE 0.9 % WEIGHT BASED INFUSION: 1.0000 mL/kg/h | INTRAVENOUS | Status: DC

## 2015-07-28 MED ORDER — HEPARIN (PORCINE) IN NACL 2-0.9 UNIT/ML-% IJ SOLN
INTRAMUSCULAR | Status: DC | PRN
  Administered 2015-07-28: 1000 mL

## 2015-07-28 MED ORDER — ACETAMINOPHEN 325 MG PO TABS: 650.0000 mg | ORAL_TABLET | ORAL | Status: DC | PRN

## 2015-07-28 MED ORDER — METOPROLOL TARTRATE 25 MG PO TABS
25.0000 mg | ORAL_TABLET | Freq: Every day | ORAL | Status: DC
  Administered 2015-07-28 – 2015-07-29 (×2): 25 mg via ORAL
  Filled 2015-07-28 (×2): qty 1

## 2015-07-28 MED ORDER — CLOPIDOGREL BISULFATE 300 MG PO TABS
ORAL_TABLET | ORAL | Status: DC | PRN
  Administered 2015-07-28: 300 mg via ORAL

## 2015-07-28 MED ORDER — SODIUM CHLORIDE 0.9% FLUSH: 3.0000 mL | Freq: Two times a day (BID) | INTRAVENOUS | Status: DC

## 2015-07-28 MED ORDER — FENTANYL CITRATE (PF) 100 MCG/2ML IJ SOLN
INTRAMUSCULAR | Status: AC
  Filled 2015-07-28: qty 2

## 2015-07-28 MED ORDER — HEPARIN SODIUM (PORCINE) 1000 UNIT/ML IJ SOLN
INTRAMUSCULAR | Status: AC
  Filled 2015-07-28: qty 1

## 2015-07-28 MED ORDER — CLOPIDOGREL BISULFATE 300 MG PO TABS
ORAL_TABLET | ORAL | Status: AC
  Filled 2015-07-28: qty 1

## 2015-07-28 MED ORDER — HEPARIN (PORCINE) IN NACL 2-0.9 UNIT/ML-% IJ SOLN
INTRAMUSCULAR | Status: AC
  Filled 2015-07-28: qty 1000

## 2015-07-28 MED ORDER — LIDOCAINE HCL (PF) 1 % IJ SOLN
INTRAMUSCULAR | Status: AC
  Filled 2015-07-28: qty 30

## 2015-07-28 MED ORDER — SODIUM CHLORIDE 0.9% FLUSH: 3.0000 mL | INTRAVENOUS | Status: DC | PRN

## 2015-07-28 MED ORDER — SODIUM CHLORIDE 0.9 % IV SOLN
INTRAVENOUS | Status: AC
  Administered 2015-07-28: 16:00:00 via INTRAVENOUS

## 2015-07-28 MED ORDER — ONDANSETRON HCL 4 MG/2ML IJ SOLN: 4.0000 mg | Freq: Four times a day (QID) | INTRAMUSCULAR | Status: DC | PRN

## 2015-07-28 MED ORDER — ASPIRIN EC 81 MG PO TBEC
81.0000 mg | DELAYED_RELEASE_TABLET | Freq: Every day | ORAL | Status: DC
  Administered 2015-07-29: 11:00:00 81 mg via ORAL
  Filled 2015-07-28: qty 1

## 2015-07-28 MED ORDER — MIDAZOLAM HCL 2 MG/2ML IJ SOLN
INTRAMUSCULAR | Status: AC
  Filled 2015-07-28: qty 2

## 2015-07-28 MED ORDER — ATORVASTATIN CALCIUM 40 MG PO TABS
40.0000 mg | ORAL_TABLET | Freq: Every day | ORAL | Status: DC
  Administered 2015-07-28: 19:00:00 40 mg via ORAL
  Filled 2015-07-28: qty 1

## 2015-07-28 MED ORDER — HYDROCODONE-ACETAMINOPHEN 5-325 MG PO TABS
1.0000 | ORAL_TABLET | Freq: Three times a day (TID) | ORAL | Status: DC | PRN
  Administered 2015-07-28 – 2015-07-29 (×3): 1 via ORAL
  Filled 2015-07-28 (×3): qty 1

## 2015-07-28 MED ORDER — IOPAMIDOL (ISOVUE-370) INJECTION 76%
INTRAVENOUS | Status: DC | PRN
  Administered 2015-07-28: 60 mL

## 2015-07-28 MED ORDER — HYDROCHLOROTHIAZIDE 12.5 MG PO CAPS
12.5000 mg | ORAL_CAPSULE | Freq: Every day | ORAL | Status: DC
  Administered 2015-07-28 – 2015-07-29 (×2): 12.5 mg via ORAL
  Filled 2015-07-28 (×2): qty 1

## 2015-07-28 MED ORDER — IRBESARTAN 300 MG PO TABS
300.0000 mg | ORAL_TABLET | Freq: Every day | ORAL | Status: DC
Start: 2015-07-28 — End: 2015-07-29
  Administered 2015-07-28 – 2015-07-29 (×2): 300 mg via ORAL
  Filled 2015-07-28 (×2): qty 1

## 2015-07-28 MED ORDER — ISOSORBIDE MONONITRATE ER 30 MG PO TB24
30.0000 mg | ORAL_TABLET | Freq: Every day | ORAL | Status: DC
  Administered 2015-07-28 – 2015-07-29 (×2): 30 mg via ORAL
  Filled 2015-07-28 (×2): qty 1

## 2015-07-28 MED ORDER — ASPIRIN 81 MG PO CHEW
CHEWABLE_TABLET | ORAL | Status: AC
  Filled 2015-07-28: qty 1

## 2015-07-28 MED ORDER — ASPIRIN 81 MG PO CHEW
81.0000 mg | CHEWABLE_TABLET | ORAL | Status: AC
  Administered 2015-07-28: 81 mg via ORAL

## 2015-07-28 MED ORDER — CLOPIDOGREL BISULFATE 75 MG PO TABS
75.0000 mg | ORAL_TABLET | Freq: Every day | ORAL | Status: DC
  Administered 2015-07-29: 75 mg via ORAL
  Filled 2015-07-28: qty 1

## 2015-07-28 MED ORDER — VERAPAMIL HCL 2.5 MG/ML IV SOLN
INTRAVENOUS | Status: AC
  Filled 2015-07-28: qty 2

## 2015-07-28 MED ORDER — HEPARIN (PORCINE) IN NACL 2-0.9 UNIT/ML-% IJ SOLN
INTRAMUSCULAR | Status: DC | PRN
  Administered 2015-07-28: 15:00:00 via INTRA_ARTERIAL

## 2015-07-28 MED ORDER — HEPARIN SODIUM (PORCINE) 1000 UNIT/ML IJ SOLN
INTRAMUSCULAR | Status: DC | PRN
  Administered 2015-07-28: 10000 [IU] via INTRAVENOUS

## 2015-07-28 MED ORDER — LIDOCAINE HCL (PF) 1 % IJ SOLN
INTRAMUSCULAR | Status: DC | PRN
  Administered 2015-07-28: 3 mL

## 2015-07-28 SURGICAL SUPPLY — 13 items
BALLN EMERGE MR 2.5X15 (BALLOONS) ×3
BALLN ~~LOC~~ TREK RX 3.0X25 (BALLOONS) ×2 IMPLANT
BALLOON EMERGE MR 2.5X15 (BALLOONS) IMPLANT
CATH VISTA GUIDE 6FR JR4 (CATHETERS) ×2 IMPLANT
DEVICE RAD COMP TR BAND LRG (VASCULAR PRODUCTS) ×2 IMPLANT
GLIDESHEATH SLEND SS 6F .021 (SHEATH) ×2 IMPLANT
KIT ENCORE 26 ADVANTAGE (KITS) ×2 IMPLANT
KIT HEART LEFT (KITS) ×3 IMPLANT
PACK CARDIAC CATHETERIZATION (CUSTOM PROCEDURE TRAY) ×3 IMPLANT
STENT PROMUS PREM MR 2.75X32 (Permanent Stent) ×2 IMPLANT
TRANSDUCER W/STOPCOCK (MISCELLANEOUS) ×3 IMPLANT
TUBING CIL FLEX 10 FLL-RA (TUBING) ×3 IMPLANT
WIRE COUGAR XT STRL 190CM (WIRE) ×2 IMPLANT

## 2015-07-28 NOTE — Interval H&P Note (Signed)
History and Physical Interval Note:  07/28/2015 2:50 PM  Marcus Montgomery  has presented today for cardiac cath/PCI with the diagnosis of unstable angina, CAD. The various methods of treatment have been discussed with the patient and family. After consideration of risks, benefits and other options for treatment, the patient has consented to  Procedure(s): Left Heart Cath and Coronary Angiography (N/A) as a surgical intervention .  The patient's history has been reviewed, patient examined, no change in status, stable for surgery.  I have reviewed the patient's chart and labs.  Questions were answered to the patient's satisfaction.    Cath Lab Visit (complete for each Cath Lab visit)  Clinical Evaluation Leading to the Procedure:   ACS: No.  Non-ACS:    Anginal Classification: CCS III  Anti-ischemic medical therapy: Maximal Therapy (2 or more classes of medications)  Non-Invasive Test Results: Intermediate-risk stress test findings: cardiac mortality 1-3%/year  Prior CABG: No previous CABG         Lauree Chandler

## 2015-07-28 NOTE — H&P (View-Only) (Signed)
Cardiology Office Note    Date:  07/24/2015   ID:  Marcus Montgomery, DOB 20-May-1945, MRN [id number]  PCP:  Patricia Nettle, MD  Cardiologist:   Sanda Klein, MD   Chief Complaint  Patient presents with  . New Evaluation    pt states he needs a stent    History of Present Illness:  Marcus Montgomery is a 70 y.o. male referred from the [facility] for percutaneous revascularization.   He presented with complaints of functional class II exertional dyspnea (less clear inconsistent mild exertional chest tightness) and underwent a nuclear stress test on June 9. This was an equivocal study, with mildly reversible defects in the mid to apical inferolateral and anteroseptal walls. The ejection fraction was 74%. He subsequently underwent cardiac catheterization and this showed the presence of an 80% mid RCA stenosis. Additional moderate atherosclerotic lesions were seen in the left coronary system (distal left main 20%, proximal LAD 50%, mid LAD 40%, circumflex 20%). Only 40 mL of Omnipaque were used for the study since he has moderate chronic kidney disease. Left ventricular systolic function was not evaluated by angiography. Left ventricular end-diastolic pressure was 16 mmHg. He was started on clopidogrel and referred for percutaneous revascularization to our institution.  Significant comorbid conditions include hypertension, hyperlipidemia chronic hepatitis C, posttraumatic stress disorder, gastroesophageal reflux disease, and most importantly chronic kidney disease. His creatinine was 2.08 on June 23, improved to 1.7 on June 26: his GFR is roughly 50 mL/minute.  Family history is significant for coronary artery disease, albeit not particularly premature ages (father 33 years old, brother 22 years old).  Past Medical History  Diagnosis Date  . Anemia   . Hepatitis C     as per liver biopsy  . Hyperplastic colon polyp   . Hypertension   . Depression   . Alcoholism ([place])   .  Leg pain, bilateral 2014    Past Surgical History  Procedure Laterality Date  . Foot surgery      Current Medications: Outpatient Prescriptions Prior to Visit  Medication Sig Dispense Refill  . BENICAR HCT 40-12.5 MG per tablet Takes one tablet by mouth once daily    . Ferrous Bisglycinate Chelate POWD 1 tablet by Does not apply route 2 (two) times daily. 1 Bottle 1  . HYDROcodone-homatropine (HYCODAN) 5-1.5 MG/5ML syrup Take 5 mLs by mouth every 6 (six) hours as needed. 240 mL 0  . ipratropium (ATROVENT) 0.03 % nasal spray Place 2 sprays into both nostrils 2 (two) times daily. 30 mL 0  . Multiple Vitamin (MULTIVITAMIN) tablet Take 1 tablet by mouth daily.    Marland Kitchen acetaminophen (TYLENOL) 325 MG tablet Take 650 mg by mouth continuous as needed for pain. Reported on 01/14/2015    . amoxicillin (AMOXIL) 875 MG tablet Take 1 tablet (875 mg total) by mouth 2 (two) times daily. 20 tablet 0  . azithromycin (ZITHROMAX Z-PAK) 250 MG tablet Take as directed on pack 6 each 0  . benzonatate (TESSALON PERLES) 100 MG capsule Take 1-2 pills 3 times daily as needed for daytime cough (Patient not taking: Reported on 07/23/2015) 20 capsule 0   No facility-administered medications prior to visit.     Allergies:   Demerol   Social History   Social History  . Marital Status: Married    Spouse Name: N/A  . Number of Children: 2  . Years of Education: N/A   Occupational History  . Retired    Social History  Main Topics  . Smoking status: Former Research scientist (life sciences)  . Smokeless tobacco: Never Used  . Alcohol Use: No  . Drug Use: No  . Sexual Activity: Not Asked   Other Topics Concern  . None   Social History Narrative     Family History:  The patient's family history includes Cancer in his mother and sister; Heart attack in his brother and father; Heart disease in his brother and father; Hypertension in his brother and father. There is no history of Colon cancer.   ROS:   Please see the history of present  illness.  He has chronic back pain and limb numbness consistent with sciatica.  ROS All other systems reviewed and are negative.   PHYSICAL EXAM:   VS:  BP 116/70 mmHg  Pulse 59  Ht 5\' 9"  (1.753 m)  Wt 80.74 kg (178 lb)  BMI 26.27 kg/m2  SpO2 97%   GEN: Well nourished, well developed, in no acute distress HEENT: normal Neck: no JVD, carotid bruits, or masses Cardiac: RRR; no murmurs, rubs, or gallops,no edema  Respiratory:  clear to auscultation bilaterally, normal work of breathing GI: soft, nontender, nondistended, + BS MS: no deformity or atrophy Skin: warm and dry, no rash Neuro:  Alert and Oriented x 3, Strength and sensation are intact Psych: euthymic mood, full affect  Wt Readings from Last 3 Encounters:  07/23/15 80.74 kg (178 lb)  01/18/15 80.287 kg (177 lb)  01/14/15 80.468 kg (177 lb 6.4 oz)      Studies/Labs Reviewed:   EKG:  EKG is ordered today.  The ekg ordered today demonstrates Mild sinus bradycardia, otherwise normal. QTC 372 ms  Recent Labs: June 26 creatinine 1.7, glucose 102, potassium 4.5, BUN 30, hemoglobin 13.1, platelets 300 There is no lipid profile available for review Additional studies/ records that were reviewed today include:  Coronary angiogram images, which the patient had on a CD    ASSESSMENT:    1. Coronary artery disease involving native coronary artery of native heart with other form of angina pectoris ([place])   2. Abnormal nuclear stress test   3. Hyperlipidemia   4. CKD (chronic kidney disease) stage 3, GFR 30-59 ml/min      PLAN:  In order of problems listed above:  1. CAD: Mr. Mcclaran has a symptomatic mid right coronary artery significant stenosis of 80%. We discussed the risks and benefits of percutaneous revascularization and stent placement. His highest risk is that of acute renal nephrotoxicity related to contrast use. We will admit him a few hours earlier for intravenous fluids and try to keep the contrast dose to a  minimum. Small risk of acute artery obstruction and need for emergency bypass surgery and other serious complication was also reviewed. Plan to perform the procedure via the radial approach which should minimize the bleeding risk. He is already on dual antiplatelet therapy, statin and beta blocker 2. The nuclear perfusion images are not available for review and the study is rather equivocal. It is possible that the inferolateral defect is related to the right coronary abnormality. No clear etiology for the apical anteroseptal defect, although he does have some diffuse LAD disease. 3. HLP: He is on a fairly high-dose of statin, but his most recent lipid profile is not available for review. 4. CKD: As above. Will administer IV fluids and really evaluate his renal function on the day of the coronary procedure.    Medication Adjustments/Labs and Tests Ordered: Current medicines are reviewed at length with  the patient today.  Concerns regarding medicines are outlined above.  Medication changes, Labs and Tests ordered today are listed in the Patient Instructions below. Patient Instructions  Your physician has requested that you have a cardiac catheterization on Tuesday, July 11th, with either Dr Martinique or Dr Claiborne Billings. Cardiac catheterization is used to diagnose and/or treat various heart conditions. Doctors may recommend this procedure for a number of different reasons. The most common reason is to evaluate chest pain. Chest pain can be a symptom of coronary artery disease (CAD), and cardiac catheterization can show whether plaque is narrowing or blocking your heart's arteries. This procedure is also used to evaluate the valves, as well as measure the blood flow and oxygen levels in different parts of your heart. For further information please visit [email or web address].   Following your catheterization, you will not be allowed to drive for 3 days. No lifting, pushing, or pulling greater that 10 pounds is allowed  for 1 week.  **Procedure should be performed in the afternoon. Patient should be admitted that morning (at least 6 hours prior to procedure) for hydration.     Signed, Sanda Klein, MD  07/24/2015 9:30 PM    [address], [place], [place]  [address] Phone: [phone]; Fax: [phone]

## 2015-07-29 ENCOUNTER — Encounter (HOSPITAL_COMMUNITY): Payer: Self-pay | Admitting: Cardiovascular Disease

## 2015-07-29 DIAGNOSIS — I10 Essential (primary) hypertension: Secondary | ICD-10-CM

## 2015-07-29 DIAGNOSIS — I25118 Atherosclerotic heart disease of native coronary artery with other forms of angina pectoris: Secondary | ICD-10-CM | POA: Diagnosis not present

## 2015-07-29 DIAGNOSIS — N183 Chronic kidney disease, stage 3 (moderate): Secondary | ICD-10-CM | POA: Diagnosis not present

## 2015-07-29 DIAGNOSIS — E785 Hyperlipidemia, unspecified: Secondary | ICD-10-CM

## 2015-07-29 DIAGNOSIS — I129 Hypertensive chronic kidney disease with stage 1 through stage 4 chronic kidney disease, or unspecified chronic kidney disease: Secondary | ICD-10-CM | POA: Diagnosis not present

## 2015-07-29 DIAGNOSIS — I25110 Atherosclerotic heart disease of native coronary artery with unstable angina pectoris: Secondary | ICD-10-CM | POA: Diagnosis not present

## 2015-07-29 DIAGNOSIS — D649 Anemia, unspecified: Secondary | ICD-10-CM

## 2015-07-29 LAB — BASIC METABOLIC PANEL
Anion gap: 7 (ref 5–15)
BUN: 21 mg/dL — AB (ref 6–20)
CALCIUM: 8.9 mg/dL (ref 8.9–10.3)
CO2: 21 mmol/L — ABNORMAL LOW (ref 22–32)
CREATININE: 1.55 mg/dL — AB (ref 0.61–1.24)
Chloride: 111 mmol/L (ref 101–111)
GFR calc Af Amer: 52 mL/min — ABNORMAL LOW (ref >60)
GFR, EST NON AFRICAN AMERICAN: 45 mL/min — AB (ref >60)
Glucose, Bld: 100 mg/dL — ABNORMAL HIGH (ref 65–99)
Potassium: 4.5 mmol/L (ref 3.5–5.1)
SODIUM: 139 mmol/L (ref 135–145)

## 2015-07-29 LAB — CBC
HCT: 32.9 % — ABNORMAL LOW (ref 39.0–52.0)
Hemoglobin: 10.8 g/dL — ABNORMAL LOW (ref 13.0–17.0)
MCH: 26.9 pg (ref 26.0–34.0)
MCHC: 32.8 g/dL (ref 30.0–36.0)
MCV: 82 fL (ref 78.0–100.0)
PLATELETS: 145 10*3/uL — AB (ref 150–400)
RBC: 4.01 MIL/uL — ABNORMAL LOW (ref 4.22–5.81)
RDW: 12.9 % (ref 11.5–15.5)
WBC: 6.6 10*3/uL (ref 4.0–10.5)

## 2015-07-29 MED ORDER — NITROGLYCERIN 0.4 MG SL SUBL: 0.4000 mg | SUBLINGUAL_TABLET | SUBLINGUAL | Status: DC | PRN

## 2015-07-29 MED ORDER — ANGIOPLASTY BOOK
Freq: Once | Status: AC
  Administered 2015-07-29: 03:00:00
  Filled 2015-07-29: qty 1

## 2015-07-29 NOTE — Progress Notes (Signed)
CARDIAC REHAB PHASE I   PRE:  Rate/Rhythm: 77 SR  BP:  Sitting: 153/66        SaO2: 97 RA  MODE:  Ambulation: 600 ft   POST:  Rate/Rhythm: 88 SR  BP:  Sitting: 134/95         SaO2: 99 RA  Pt ambulated 600 ft on RA, handheld assist, steady gait, tolerated well with no complaints. Completed PCI/stent education.  Reviewed risk factors, stent book, anti-platelet therapy, stent card, activity restrictions, ntg, exercise, heart healthy diet, portion control, and phase 2 cardiac rehab. Pt verbalized understanding, receptive to education. Pt agrees to phase 2 cardiac rehab referral, will send to [facility] per pt request. Pt to recliner after walk, call bell within reach.   [id number] Lenna Sciara, RN, BSN 07/29/2015 8:44 AM

## 2015-07-29 NOTE — Progress Notes (Signed)
Patient: Marcus Montgomery / Admit Date: 07/28/2015 / Date of Encounter: 07/29/2015, 10:02 AM   Subjective: Feeling great. Wants to go home. Not sure who he needs to f/u with.   Objective: Telemetry: NSR one PVC Physical Exam: Blood pressure 134/95, pulse 74, temperature 97.7 F (36.5 C), temperature source Oral, resp. rate 20, height 5\' 9"  (1.753 m), weight 177 lb 14.6 oz (80.7 kg), SpO2 99 %. General: Well developed, well nourished M in no acute distress. Head: Normocephalic, atraumatic, sclera non-icteric, no xanthomas, nares are without discharge. Neck: Negative for carotid bruits. JVP not elevated. Lungs: Clear bilaterally to auscultation without wheezes, rales, or rhonchi. Breathing is unlabored. Heart: RRR S1 S2 without murmurs, rubs, or gallops.  Abdomen: Soft, non-tender, non-distended with normoactive bowel sounds. No rebound/guarding. Extremities: No clubbing or cyanosis. No edema. Distal pedal pulses are 2+ and equal bilaterally. Right radial cath site without hematoma or ecchymosis; good pulse. Neuro: Alert and oriented X 3. Moves all extremities spontaneously. Psych:  Responds to questions appropriately with a normal affect.   Intake/Output Summary (Last 24 hours) at 07/29/15 1002 Last data filed at 07/29/15 0600  Gross per 24 hour  Intake    840 ml  Output   1550 ml  Net   -710 ml    Inpatient Medications:  . aspirin EC  81 mg Oral Daily  . atorvastatin  40 mg Oral q1800  . clopidogrel  75 mg Oral Daily  . irbesartan  300 mg Oral Daily   And  . hydrochlorothiazide  12.5 mg Oral Daily  . isosorbide mononitrate  30 mg Oral Daily  . metoprolol tartrate  25 mg Oral Daily  . multivitamin with minerals  1 tablet Oral Daily  . sodium chloride flush  3 mL Intravenous Q12H   Infusions:    Labs:  Recent Labs  07/28/15 0730 07/29/15 0559  NA 142 139  K 4.3 4.5  CL 109 111  CO2 25 21*  GLUCOSE 112* 100*  BUN 24* 21*  CREATININE 1.82* 1.55*  CALCIUM 9.3 8.9    No results for input(s): AST, ALT, ALKPHOS, BILITOT, PROT, ALBUMIN in the last 72 hours.  Recent Labs  07/28/15 0730 07/29/15 0559  WBC 6.2 6.6  HGB 12.3* 10.8*  HCT 36.2* 32.9*  MCV 81.9 82.0  PLT 155 145*   No results for input(s): CKTOTAL, CKMB, TROPONINI in the last 72 hours. Invalid input(s): POCBNP No results for input(s): HGBA1C in the last 72 hours.   Radiology/Studies:  No results found.   Assessment and Plan  30M with history of CAD, HTN, HLD, chronic hep C, PTSD, GERD, CKD stage III who presented to [facility] for planned revascularization. He recently underwent nuc which was equivocal -> subsequent cath with significant RCA lesion. Started on Plavix and referred for PCI..  1. CAD - s/p DES to [facility] 07/28/15. Continue ASA, Plavix, BB, statin. ? Change Lopressor to Toprol or increase to BID.  2. CKD stage III - Cr appears stable post-cath. Can consider recheck in f/u.  3. HTN - follow on present regimen.  4. Anemia - ? Procedurally related. No bleeding reported.  Signed, Melina Copa PA-C Pager: [phone]   I have seen and examined the patient along with Melina Copa PA-C.  I have reviewed the chart, notes and new data.  I agree with PA's note.  Key new complaints: no complaints at access site Key examination changes: normal exam Key new findings / data: creatinine improved post cath  PLAN:  DC home. F/u in office. Cardiac rehab. Reinforced critical importance of DAPT and important role of statin prevention of disease progression.  Sanda Klein, MD, [place] [phone] 07/29/2015, 10:44 AM

## 2015-07-29 NOTE — Discharge Summary (Signed)
Discharge Summary    Patient ID: Marcus Montgomery,  MRN: [id number], DOB/AGE: 68-27-1947 68 y.o.  Admit date: 07/28/2015 Discharge date: 07/29/2015  Primary Care Provider: Patricia Montgomery Primary Cardiologist: Dr. Sallyanne Montgomery (patient wants to switch from [place])  Discharge Diagnoses    Principal Problem:   Unstable angina ([place]) Active Problems:   CAD (coronary artery disease)   Hyperlipidemia   CKD (chronic kidney disease) stage 3, GFR 30-59 ml/min   Abnormal nuclear stress test   Essential hypertension   Normocytic anemia    Diagnostic Studies/Procedures    1. Cardiac catheterization this admission, please see full report and below for summary. _____________   History of Present Illness & Hospital Course    [phone] with history of CAD, HTN, HLD, chronic hep C, PTSD, GERD, CKD stage III who presented to [facility] for planned revascularization.   He recently saw the [place] for functional class II exertional dyspnea (less clear inconsistent mild exertional chest tightness) and underwent a nuclear stress test on June 9. This was an equivocal study, with mildly reversible defects in the mid to apical inferolateral and anteroseptal walls. The ejection fraction was 74%. He subsequently underwent cardiac catheterization and this showed the presence of an 80% mid RCA stenosis. Additional moderate atherosclerotic lesions were seen in the left coronary system (distal left main 20%, proximal LAD 50%, mid LAD 40%, circumflex 20%). Only 40 mL of Omnipaque were used for the study since he has moderate chronic kidney disease. Left ventricular systolic function was not evaluated by angiography. Left ventricular end-diastolic pressure was 16 mmHg. He was started on clopidogrel and referred for percutaneous revascularization to our institution. He was admitted yesterday morning for planned hydration in prep for cardiac cath. Cath 07/28/15 showed 50% mRCA and 80% mRCA which was treated with PTCA/DES x1. It should be noted  that his metoprolol (Lopressor) was 25mg  once daily - we split this up to 12.5mg  BID since it is a BID medicine. Can consider titrating further to 25mg  BID in f/u. Cr remained stable at 1.55 (previously 2.08 on 07/10/15 and 1.7 on 6/26). The patient tolerated the procedure well. He did have a mild drop in Hgb felt procedurally related - no bleeding was reported and radial cath site was without complication. Pre-cath Hgb 12.3, post-cath Hgb 10.8. Can consider repeat CBC and BMET at f/u appt. The patient prefers to f/u in our office. Dr. Sallyanne Montgomery has seen and examined the patient today and feels he is stable for discharge.  Consultants: N/A _____________  Discharge Vitals Blood pressure 134/95, pulse 74, temperature 97.7 F (36.5 C), temperature source Oral, resp. rate 20, height 5\' 9"  (1.753 m), weight 177 lb 14.6 oz (80.7 kg), SpO2 99 %.  Filed Weights   07/28/15 0628 07/29/15 0247  Weight: 177 lb (80.287 kg) 177 lb 14.6 oz (80.7 kg)    Labs & Radiologic Studies    CBC  Recent Labs  07/28/15 0730 07/29/15 0559  WBC 6.2 6.6  HGB 12.3* 10.8*  HCT 36.2* 32.9*  MCV 81.9 82.0  PLT 155 [id number]*   Basic Metabolic Panel  Recent Labs  07/28/15 0730 07/29/15 0559  NA 142 139  K 4.3 4.5  CL 109 111  CO2 25 21*  GLUCOSE 112* 100*  BUN 24* 21*  CREATININE 1.82* 1.55*  CALCIUM 9.3 8.9   ____________  No results found. Disposition   Pt is being discharged home today in good condition.  Follow-up Plans & Appointments    Follow-up Information  Follow up with Marcus Pitter, PA-C.   Specialties:  Cardiology, Radiology   Why:  [facility] - 08/06/15 at 9am. Note this will be at the [facility] location for your post-hospital visit. Marcus Montgomery is one of the PAs that works with Dr. Leary Montgomery information:   [address] [place] [address] [phone]      Discharge Instructions    Amb Referral to Cardiac Rehabilitation    Complete by:  As directed    Diagnosis:  Coronary Stents     Diet - low sodium heart healthy    Complete by:  As directed   No driving for 2 days. No lifting over 5 lbs for 1 week. No sexual activity for 1 week. Keep procedure site clean & dry. If you notice increased pain, swelling, bleeding or pus, call/return!  You may shower, but no soaking baths/hot tubs/pools for 1 week.     Increase activity slowly    Complete by:  As directed            Discharge Medications   Current Discharge Medication List    START taking these medications   Details  nitroGLYCERIN (NITROSTAT) 0.4 MG SL tablet Place 1 tablet (0.4 mg total) under the tongue every 5 (five) minutes as needed for chest pain (up to 3 doses). Qty: 25 tablet, Refills: 3      CONTINUE these medications which have NOT CHANGED   Details  aspirin 81 MG tablet Take 81 mg by mouth daily.    atorvastatin (LIPITOR) 40 MG tablet Take 40 mg by mouth daily.    BENICAR HCT 40-12.5 MG per tablet Take 1 tablet by mouth daily.     clopidogrel (PLAVIX) 75 MG tablet Take 75 mg by mouth daily.    HYDROcodone-acetaminophen (NORCO/VICODIN) 5-325 MG tablet Take 1 tablet by mouth 3 (three) times daily as needed for moderate pain.    isosorbide mononitrate (IMDUR) 30 MG 24 hr tablet Take 30 mg by mouth daily.    metoprolol tartrate (LOPRESSOR) 25 MG tablet Take 12.5 mg by mouth 2 (two) times daily.    Multiple Vitamin (MULTIVITAMIN) tablet Take 1 tablet by mouth daily.         Allergies:  Allergies  Allergen Reactions  . Demerol [Meperidine] Diarrhea     Outstanding Labs/Studies   See above - will consider bmet/cbc at f/u  Duration of Discharge Encounter   Greater than 30 minutes including physician time.  Signed, Marcus Hai Sher Shampine PA-C 07/29/2015, 11:05 AM

## 2015-08-04 NOTE — Progress Notes (Signed)
Cardiology Office Note    Date:  08/06/2015  ID:  Marcus Montgomery, DOB 1945-07-14, MRN [id number] PCP:  Patricia Nettle, MD  Cardiologist: Dr. Sallyanne Kuster   Chief Complaint: f/u stenting  History of Present Illness:  Marcus Montgomery is a 71 y.o. male with history of CAD (s/p DES to RCA 07/2015), HTN, HLD, chronic hep C, PTSD, GERD, CKD stage III who presents for f/u. He was recently seen at the [facility] for chest pain and underwent abnormal nuc (EF 74%) leading to [place]. This showed significant RCA disease as well as moderate atherosclerotic lesions in the left coronary system (distal left main 20%, proximal LAD 50%, mid LAD 40%, circumflex 20%). He was admitted for hydration for planned PCI and underwent staged cath 07/28/15 showing 50% mRCA and 80% mRCA which was treated with PTCA/DES x1. Post-cath Cr was stable at 1.55 (previously 2.08 on 07/10/15 and 1.7 on 6/26). Pre-cath Hgb 12.3, post-cath Hgb 10.8, no bleeding reported.   He comes in today for follow-up overall feeling well. He is recovering from a URI in the interim. No chest pain, SOB, syncope, dizziness, or palpitations. BP running on the lower side but he took cough syrup (hycodan) this AM. He saw PCP yesterday and was told it was normal.  Past Medical History  Diagnosis Date  . Anemia   . Hyperplastic colon polyp   . Hypertension   . Depression   . Alcoholism ([place])   . Leg pain, bilateral 2014  . Coronary artery disease     a Progressive angina [facility] 80% RCA, distal left main 20%, proximal LAD 50%, mid LAD 40%, circumflex 20%) -> s/p PCI/DES to RCA 07/28/15 (50% mRCA, 80% mRCA).  . High cholesterol   . Pneumonia 1970s X 1  . GERD (gastroesophageal reflux disease)   . History of hiatal hernia   . Hepatitis C     as per liver biopsy  . Chronic lower back pain   . Chronic kidney disease (CKD), stage III (moderate)     Past Surgical History  Procedure Laterality Date  . Foot surgery Left X 9    "bone spurs,  bruised bones, etc."  . Cardiac catheterization  06/2015  . Coronary angioplasty with stent placement  07/28/2015    "1 stent"  . Colonoscopy w/ biopsies and polypectomy    . Esophagogastroduodenoscopy    . Cardiac catheterization N/A 07/28/2015    Procedure: Coronary Stent Intervention;  Surgeon: Burnell Blanks, MD;  Location: [facility];  Service: Cardiovascular;  Laterality: N/A;    Current Medications: Current Outpatient Prescriptions  Medication Sig Dispense Refill  . aspirin 81 MG tablet Take 81 mg by mouth daily.    Marland Kitchen atorvastatin (LIPITOR) 40 MG tablet Take 40 mg by mouth daily.    Marland Kitchen BENICAR HCT 40-12.5 MG per tablet Take 1 tablet by mouth daily.     . clopidogrel (PLAVIX) 75 MG tablet Take 75 mg by mouth daily.    Marland Kitchen HYDROcodone-acetaminophen (NORCO/VICODIN) 5-325 MG tablet Take 1 tablet by mouth 3 (three) times daily as needed for moderate pain.    Marland Kitchen HYDROcodone-homatropine (HYCODAN) 5-1.5 MG/5ML syrup Take 5 mLs by mouth every 6 (six) hours as needed for cough.     . isosorbide mononitrate (IMDUR) 30 MG 24 hr tablet Take 30 mg by mouth daily.    . metoprolol tartrate (LOPRESSOR) 25 MG tablet Take 12.5 mg by mouth 2 (two) times daily.    Marland Kitchen  Multiple Vitamin (MULTIVITAMIN) tablet Take 1 tablet by mouth daily.    . nitroGLYCERIN (NITROSTAT) 0.4 MG SL tablet Place 1 tablet (0.4 mg total) under the tongue every 5 (five) minutes as needed for chest pain (up to 3 doses). 25 tablet 3   No current facility-administered medications for this visit.     Allergies:   Demerol   Social History   Social History  . Marital Status: Married    Spouse Name: N/A  . Number of Children: 2  . Years of Education: N/A   Occupational History  . Retired    Social History Main Topics  . Smoking status: Former Smoker -- 1.00 packs/day for 30 years    Types: Cigarettes  . Smokeless tobacco: Never Used     Comment: "quit smoking cigarettes in ~ 1995"  . Alcohol Use: Yes      Comment: 07/28/2015 "stopped drinking in ~ 1995"  . Drug Use: No  . Sexual Activity: Not Currently   Other Topics Concern  . None   Social History Narrative     Family History:  The patient's family history includes Cancer in his mother and sister; Heart attack in his brother and father; Heart disease in his brother and father; Hypertension in his brother and father. There is no history of Colon cancer.   ROS:   Please see the history of present illness.  All other systems are reviewed and otherwise negative.    PHYSICAL EXAM:   VS:  BP 98/58 mmHg  Pulse 90  Ht 5\' 9"  (1.753 m)  Wt 174 lb 3.2 oz (79.017 kg)  BMI 25.71 kg/m2  BMI: Body mass index is 25.71 kg/(m^2). GEN: Well nourished, well developed AAM, in no acute distress HEENT: normocephalic, atraumatic Neck: no JVD, carotid bruits, or masses Cardiac: RRR; no murmurs, rubs, or gallops, no edema  Respiratory:  clear to auscultation bilaterally, normal work of breathing GI: soft, nontender, nondistended, + BS MS: no deformity or atrophy Skin: warm and dry, no rash, right radial cath site with ecchymosis in late stage of resolution, no hematoma, good pulse. Neuro:  Alert and Oriented x 3, Strength and sensation are intact, follows commands Psych: euthymic mood, full affect  Wt Readings from Last 3 Encounters:  08/06/15 174 lb 3.2 oz (79.017 kg)  07/29/15 177 lb 14.6 oz (80.7 kg)  07/23/15 178 lb (80.74 kg)      Studies/Labs Reviewed:   EKG:  EKG was ordered today and personally reviewed by me and demonstrates NSR 91bpm, no acute changes.  Recent Labs: 07/29/2015: BUN 21*; Creatinine, Ser 1.55*; Hemoglobin 10.8*; Platelets 145*; Potassium 4.5; Sodium 139   Lipid Panel No results found for: CHOL, TRIG, HDL, CHOLHDL, VLDL, LDLCALC, LDLDIRECT  Additional studies/ records that were reviewed today include: Summarized above.    ASSESSMENT & PLAN:   1. CAD - doing well post-PCI. Continue ASA, BB, statin, Plavix, Imdur.  He plans to participate in cardiac rehab. I told him if he does not hear anything regarding this in the next several days, to let our office know. 2. Essential HTN - BP running on lower side today, question 2/2 Hycodan. He is asymptomatic. States BP was normal yesterday at PCP's office and that it usually runs normal. He was 130s/90s in the hospital. I asked patient to follow for now and call if habitually running [id number] systolic which in that case I would recommend discontinuing the HCTZ component of his Benicar HCT. Will recheck Hgb today given downtrend in  the hospital to exclude progressive anemia. 3. CKD stage III - recheck BMET today to ensure stability. F/u PCP. 4. Hyperlipidemia - continue chronic high dose statin. Further monitoring of lipids will be at discretion of primary cardiologist.  Disposition: F/u with Dr. Sallyanne Kuster in 3 months.  Medication Adjustments/Labs and Tests Ordered: Current medicines are reviewed at length with the patient today.  Concerns regarding medicines are outlined above. Medication changes, Labs and Tests ordered today are summarized above and listed in the Patient Instructions accessible in Encounters.   Raechel Ache PA-C  08/06/2015 9:20 AM    [address], [place], [place]  [address] Phone: [phone]; Fax: [phone]

## 2015-08-05 ENCOUNTER — Other Ambulatory Visit: Payer: Self-pay | Admitting: Cardiology

## 2015-08-05 ENCOUNTER — Ambulatory Visit
Admission: RE | Admit: 2015-08-05 | Discharge: 2015-08-05 | Disposition: A | Discharge status: Home / Self Care | Payer: 59 | Source: Ambulatory Visit | Attending: Cardiology | Admitting: Cardiology

## 2015-08-05 DIAGNOSIS — R05 Cough: Secondary | ICD-10-CM

## 2015-08-05 DIAGNOSIS — R059 Cough, unspecified: Secondary | ICD-10-CM

## 2015-08-06 ENCOUNTER — Telehealth: Payer: Self-pay

## 2015-08-06 ENCOUNTER — Ambulatory Visit (INDEPENDENT_AMBULATORY_CARE_PROVIDER_SITE_OTHER): Payer: Non-veteran care | Admitting: Physician Assistant

## 2015-08-06 ENCOUNTER — Encounter (INDEPENDENT_AMBULATORY_CARE_PROVIDER_SITE_OTHER): Payer: Self-pay

## 2015-08-06 ENCOUNTER — Encounter: Payer: Self-pay | Admitting: Physician Assistant

## 2015-08-06 VITALS — BP 98/58 | HR 90 | Ht 69.0 in | Wt 174.2 lb

## 2015-08-06 DIAGNOSIS — N183 Chronic kidney disease, stage 3 unspecified: Secondary | ICD-10-CM

## 2015-08-06 DIAGNOSIS — I10 Essential (primary) hypertension: Secondary | ICD-10-CM | POA: Diagnosis not present

## 2015-08-06 DIAGNOSIS — Z9861 Coronary angioplasty status: Secondary | ICD-10-CM

## 2015-08-06 DIAGNOSIS — E785 Hyperlipidemia, unspecified: Secondary | ICD-10-CM

## 2015-08-06 DIAGNOSIS — I2510 Atherosclerotic heart disease of native coronary artery without angina pectoris: Secondary | ICD-10-CM | POA: Diagnosis not present

## 2015-08-06 LAB — BASIC METABOLIC PANEL
BUN: 38 mg/dL — ABNORMAL HIGH (ref 7–25)
CHLORIDE: 102 mmol/L (ref 98–110)
CO2: 24 mmol/L (ref 20–31)
CREATININE: 2.57 mg/dL — AB (ref 0.70–1.25)
Calcium: 9 mg/dL (ref 8.6–10.3)
Glucose, Bld: 126 mg/dL — ABNORMAL HIGH (ref 65–99)
Potassium: 4.2 mmol/L (ref 3.5–5.3)
SODIUM: 138 mmol/L (ref 135–146)

## 2015-08-06 LAB — CBC
HEMATOCRIT: 37.1 % — AB (ref 38.5–50.0)
Hemoglobin: 12.5 g/dL — ABNORMAL LOW (ref 13.2–17.1)
MCH: 27.8 pg (ref 27.0–33.0)
MCHC: 33.7 g/dL (ref 32.0–36.0)
MCV: 82.4 fL (ref 80.0–100.0)
MPV: 9.9 fL (ref 7.5–12.5)
PLATELETS: 212 10*3/uL (ref 140–400)
RBC: 4.5 MIL/uL (ref 4.20–5.80)
RDW: 13.7 % (ref 11.0–15.0)
WBC: 6.2 10*3/uL (ref 3.8–10.8)

## 2015-08-06 NOTE — Telephone Encounter (Signed)
Informed patient of results and verbal understanding expressed.   Instructed patient to STOP BENICAR, drink plenty of water, and avoid NSAIDs.  Patient has no BP cuff at home. Scheduled patient for a BP check on Monday in the HTN Clinic. He understands he will have repeat BMET Monday as well. He agrees with treatment plan.

## 2015-08-06 NOTE — Addendum Note (Signed)
Addended by: Harland German A on: 08/06/2015 05:47 PM   Modules accepted: Orders

## 2015-08-06 NOTE — Telephone Encounter (Signed)
-----   Message from Charlie Pitter, [place] sent at 08/06/2015  5:17 PM EDT ----- Please call patient. Kidneys appear stressed, which can occur after contrast exposure during cardiac cath in the setting of chronic kidney disease. His outpatient Cr before cath were 1.8-2.0 and now it is 2.57 (I suspect most recent 1.55 was only lower in the setting of hydration and holding ARB/HCTZ around time of cath). Recommend he hold Benicar HCT for now, drink plenty of water, and recheck BMET early next week. Avoid NSAIDS. Follow BP off Benicar HCT and call if running higher than [id number] systolic.  Hgb is back to baseline.  Dayna Dunn PA-C

## 2015-08-06 NOTE — Patient Instructions (Addendum)
Medication Instructions:  Your physician recommends that you continue on your current medications as directed. Please refer to the Current Medication list given to you today.   Labwork: TODAY:  BMET & CBC  Testing/Procedures: None ordered  Follow-Up: Your physician wants you to follow-up in: 3 MONTHS WITH DR. Sallyanne Kuster   You will receive a reminder letter in the mail two months in advance. If you don't receive a letter, please call our office to schedule the follow-up appointment.   Any Other Special Instructions Will Be Listed Below (If Applicable). Your physician has requested that you regularly monitor and record your blood pressure readings at home. Please use the same machine at the same time of day to check your readings and record them to bring to your follow-up visit. If your blood pressure tends to run on the lower side, <100 on the top, cal our office at [phone]      If you need a refill on your cardiac medications before your next appointment, please call your pharmacy.

## 2015-08-10 ENCOUNTER — Ambulatory Visit (INDEPENDENT_AMBULATORY_CARE_PROVIDER_SITE_OTHER): Payer: Non-veteran care | Admitting: Pharmacist

## 2015-08-10 ENCOUNTER — Other Ambulatory Visit: Payer: Non-veteran care | Admitting: *Deleted

## 2015-08-10 VITALS — BP 124/68 | HR 81 | Wt 179.2 lb

## 2015-08-10 DIAGNOSIS — I10 Essential (primary) hypertension: Secondary | ICD-10-CM

## 2015-08-10 NOTE — Progress Notes (Signed)
Patient ID: Marcus Montgomery                 DOB: Mar 11, 1945                      MRN: [id number]     HPI: Marcus Montgomery is a 70 y.o. male referred by Melina Copa to HTN clinic. PMH is significant for CAD (s/p DES to RCA 07/2015), HTN, HLD, chronic hep C, PTSD, GERD, and CKD stage III. He was admitted for planned PCI and underwent staged cath 07/28/15 showing 50% mRCA and 80% mRCA which was treated with PTCA/DES x1. Post-cath Cr was stable at 1.55 but increased to 2.57 at last OV on 7/20, likely due to contrast exposure during cardiac cath. Pt was advised to hold Benicar-HCTZ and hydrate. Presents today for BMET and BP check.  Pt reports that he never stopped taking his Benicar-HCTZ. Reports that he went to the [facility] in [place] last Friday and they told him to keep taking it. Pt reports he checks his BP at home - checked this past weekend and systolic BP was 0000000.  Current HTN meds: metoprolol 12.5mg  BID, Imdur 30mg  daily BP goal: < 140/6mmHg  Family History: Father and brother with heart disease, HTN, and MI.  Social History: Former smoker - 1 PPD for 30 years. Quit smoking in 1995. Also quit drinking in 1995. Denies illicit drug use.    Wt Readings from Last 3 Encounters:  08/06/15 174 lb 3.2 oz (79 kg)  07/29/15 177 lb 14.6 oz (80.7 kg)  07/23/15 178 lb (80.7 kg)   BP Readings from Last 3 Encounters:  08/06/15 (!) 98/58  07/29/15 (!) 134/95  07/23/15 116/70   Pulse Readings from Last 3 Encounters:  08/06/15 90  07/29/15 74  07/23/15 (!) 59    Renal function: Estimated Creatinine Clearance: 27.9 mL/min (by C-G formula based on SCr of 2.57 mg/dL).  Past Medical History:  Diagnosis Date  . Alcoholism ([place])   . Anemia   . Chronic kidney disease (CKD), stage III (moderate)   . Chronic lower back pain   . Coronary artery disease    a Progressive [facility]ina [facility] 80% [facility], distal left main 20%, proximal LAD 50%, mid LAD 40%, circumflex 20%) -> s/p PCI/DES to RCA  07/28/15 (50% mRCA, 80% mRCA).  . Depression   . GERD (gastroesophageal reflux disease)   . Hepatitis C    as per liver biopsy  . High cholesterol   . History of hiatal hernia   . Hyperplastic colon polyp   . Hypertension   . Leg pain, bilateral 2014  . Pneumonia 1970s X 1    Current Outpatient Prescriptions on File Prior to Visit  Medication Sig Dispense Refill  . aspirin 81 MG tablet Take 81 mg by mouth daily.    Marland Kitchen atorvastatin (LIPITOR) 40 MG tablet Take 40 mg by mouth daily.    . clopidogrel (PLAVIX) 75 MG tablet Take 75 mg by mouth daily.    Marland Kitchen HYDROcodone-acetaminophen (NORCO/VICODIN) 5-325 MG tablet Take 1 tablet by mouth 3 (three) times daily as needed for moderate pain.    Marland Kitchen HYDROcodone-homatropine (HYCODAN) 5-1.5 MG/5ML syrup Take 5 mLs by mouth every 6 (six) hours as needed for cough.     . isosorbide mononitrate (IMDUR) 30 MG 24 hr tablet Take 30 mg by mouth daily.    . metoprolol tartrate (LOPRESSOR) 25 MG tablet Take 12.5 mg by mouth  2 (two) times daily.    . Multiple Vitamin (MULTIVITAMIN) tablet Take 1 tablet by mouth daily.    . nitroGLYCERIN (NITROSTAT) 0.4 MG SL tablet Place 1 tablet (0.4 mg total) under the tongue every 5 (five) minutes as needed for chest pain (up to 3 doses). 25 tablet 3   No current facility-administered medications on file prior to visit.     Allergies  Allergen Reactions  . Demerol [Meperidine] Diarrhea     Assessment/Plan:   1. Hypertension - Blood pressure controlled and well below goal < 140/43mmHg. Pt was supposed to hold his Benicar-HCTZ for the past 4 days d/t increase in SCr from 1.55 to 2.57 after cardiac cath. Pt never held because he went to the [facility] on Friday and they apparently told him to continue taking it. [facility] and [facility]. Will still recheck BMET today and make and medication adjustments if necessary.   Megan E. Supple, PharmD, [place] [id number].  [address] [place], [place] [address] Phone: [phone]; Fax: [phone] 08/10/2015 2:23 PM

## 2015-08-11 ENCOUNTER — Telehealth: Payer: Self-pay | Admitting: *Deleted

## 2015-08-11 DIAGNOSIS — I10 Essential (primary) hypertension: Secondary | ICD-10-CM

## 2015-08-11 LAB — BASIC METABOLIC PANEL
BUN: 40 mg/dL — AB (ref 7–25)
CALCIUM: 9 mg/dL (ref 8.6–10.3)
CHLORIDE: 105 mmol/L (ref 98–110)
CO2: 26 mmol/L (ref 20–31)
CREATININE: 2.36 mg/dL — AB (ref 0.70–1.25)
Glucose, Bld: 113 mg/dL — ABNORMAL HIGH (ref 65–99)
Potassium: 6 mmol/L — ABNORMAL HIGH (ref 3.5–5.3)
Sodium: 143 mmol/L (ref 135–146)

## 2015-08-11 NOTE — Telephone Encounter (Signed)
Pt is going to hold Benicar X's 3 days and repeat lab on Friday, 08/14/15. Order In EPIC.

## 2015-08-11 NOTE — Telephone Encounter (Signed)
-----   Message from Charlie Pitter, [place] sent at 08/11/2015  7:34 AM EDT ----- See phone note on prior BMET - Cr remains elevated post-cath.  Per note from pharmacist yesterday: "Hypertension - Blood pressure controlled and well below goal < 140/38mmHg. Pt was supposed to hold his Benicar-HCTZ for the past 4 days d/t increase in SCr from 1.55 to 2.57 after cardiac cath. Pt never held because he went to the [facility] on Friday and they apparently told him to continue taking it. [facility] and [facility] were [facility] on [facility]. Will still recheck BMET today and make and medication adjustments if necessary."  His Cr remains elevated above baseline. Would again recommend he hold Benicar HCTZ for several days and recheck after holding - recheck Thurs or Friday. Alternatively if the [facility] is managing this (see above), he can contact the [facility] with this lab value to inform them that it still is running higher to find out what monitoring they would like to do. This needs to be followed to ensure improvement. Dayna Dunn PA-C

## 2015-08-14 ENCOUNTER — Other Ambulatory Visit: Payer: Non-veteran care

## 2015-08-26 ENCOUNTER — Ambulatory Visit: Payer: Non-veteran care | Admitting: Internal Medicine

## 2015-11-13 ENCOUNTER — Ambulatory Visit (INDEPENDENT_AMBULATORY_CARE_PROVIDER_SITE_OTHER): Payer: PRIVATE HEALTH INSURANCE | Admitting: Family Medicine

## 2015-11-13 ENCOUNTER — Ambulatory Visit (INDEPENDENT_AMBULATORY_CARE_PROVIDER_SITE_OTHER): Payer: PRIVATE HEALTH INSURANCE

## 2015-11-13 VITALS — BP 124/68 | HR 103 | Temp 98.2°F | Resp 18 | Ht 69.0 in | Wt 174.8 lb

## 2015-11-13 DIAGNOSIS — N183 Chronic kidney disease, stage 3 unspecified: Secondary | ICD-10-CM

## 2015-11-13 DIAGNOSIS — M79674 Pain in right toe(s): Secondary | ICD-10-CM

## 2015-11-13 LAB — CBC
HEMATOCRIT: 39.1 % (ref 38.5–50.0)
Hemoglobin: 13.1 g/dL — ABNORMAL LOW (ref 13.2–17.1)
MCH: 27.4 pg (ref 27.0–33.0)
MCHC: 33.5 g/dL (ref 32.0–36.0)
MCV: 81.8 fL (ref 80.0–100.0)
MPV: 10 fL (ref 7.5–12.5)
PLATELETS: 227 10*3/uL (ref 140–400)
RBC: 4.78 MIL/uL (ref 4.20–5.80)
RDW: 14.6 % (ref 11.0–15.0)
WBC: 7.7 10*3/uL (ref 3.8–10.8)

## 2015-11-13 MED ORDER — COLCHICINE 0.6 MG PO TABS: 0.3000 mg | ORAL_TABLET | Freq: Every day | ORAL | 1 refills | Status: DC

## 2015-11-13 MED ORDER — HYDROCODONE-ACETAMINOPHEN 5-325 MG PO TABS: 1.0000 | ORAL_TABLET | Freq: Three times a day (TID) | ORAL | 0 refills | Status: AC | PRN

## 2015-11-13 NOTE — Progress Notes (Signed)
Marcus Montgomery is a 70 y.o. male who presents to [facility] today for right great toe pain. Patient notes a 2 day history of worsening pain in the right great toe. The pain is worse with motion. He notes significant pain with walking.Marland Kitchen He denies any injury. No fevers chills nausea vomiting or diarrhea. He has a pertinent significant medical history for stage 3-4 chronic kidney disease, coronary artery disease, alcoholism, hypertension, and hepatitis C. To his knowledge he has never been diagnosed with gout. He has not tried much medications yet. He has not been seen by a medical doctor since July.   Past Medical History:  Diagnosis Date  . Alcoholism ([place])   . Anemia   . Chronic kidney disease (CKD), stage III (moderate)   . Chronic lower back pain   . Coronary artery disease    a Progressive angina 07/[facility] showing 80% RCA, distal left main 20%, proximal LAD 50%, mid LAD 40%, circumflex 20%) -> s/p PCI/DES to RCA 07/28/15 (50% mRCA, 80% mRCA).  . Depression   . GERD (gastroesophageal reflux disease)   . Hepatitis C    as per liver biopsy  . High cholesterol   . History of hiatal hernia   . Hyperplastic colon polyp   . Hypertension   . Leg pain, bilateral 2014  . Pneumonia 1970s X 1   Past Surgical History:  Procedure Laterality Date  . CARDIAC CATHETERIZATION  06/2015  . CARDIAC CATHETERIZATION N/A 07/28/2015   Procedure: Coronary Stent Intervention;  Surgeon: Burnell Blanks, MD;  Location: [facility];  Service: Cardiovascular;  Laterality: N/A;  . COLONOSCOPY W/ BIOPSIES AND POLYPECTOMY    . CORONARY ANGIOPLASTY WITH STENT PLACEMENT  07/28/2015   "1 stent"  . ESOPHAGOGASTRODUODENOSCOPY    . FOOT SURGERY Left X 9   "bone spurs, bruised bones, etc."   Social History  Substance Use Topics  . Smoking status: Former Smoker    Packs/day: 1.00    Years: 30.00    Types: Cigarettes  . Smokeless tobacco: Never Used     Comment: "quit smoking cigarettes in  ~ 1995"  . Alcohol use Yes     Comment: 07/28/2015 "stopped drinking in ~ 1995"   ROS as above Medications: Current Outpatient Prescriptions  Medication Sig Dispense Refill  . aspirin 81 MG tablet Take 81 mg by mouth daily.    Marland Kitchen atorvastatin (LIPITOR) 40 MG tablet Take 40 mg by mouth daily.    . clopidogrel (PLAVIX) 75 MG tablet Take 75 mg by mouth daily.    . isosorbide mononitrate (IMDUR) 30 MG 24 hr tablet Take 30 mg by mouth daily.    . metoprolol tartrate (LOPRESSOR) 25 MG tablet Take 12.5 mg by mouth 2 (two) times daily.    . Multiple Vitamin (MULTIVITAMIN) tablet Take 1 tablet by mouth daily.    . nitroGLYCERIN (NITROSTAT) 0.4 MG SL tablet Place 1 tablet (0.4 mg total) under the tongue every 5 (five) minutes as needed for chest pain (up to 3 doses). 25 tablet 3  . olmesartan-hydrochlorothiazide (BENICAR HCT) 40-12.5 MG tablet Take 1 tablet by mouth daily.    . colchicine 0.6 MG tablet Take 0.5 tablets (0.3 mg total) by mouth daily. 7 tablet 1  . HYDROcodone-acetaminophen (NORCO/VICODIN) 5-325 MG tablet Take 1 tablet by mouth 3 (three) times daily as needed for moderate pain. 10 tablet 0   No current facility-administered medications for this visit.    Allergies  Allergen Reactions  . Demerol [Meperidine] Diarrhea     Exam:  BP 124/68   Pulse (!) 103   Temp 98.2 F (36.8 C) (Oral)   Resp 18   Ht 5\' 9"  (1.753 m)   Wt 174 lb 12.8 oz (79.3 kg)   SpO2 99%   BMI 25.81 kg/m  Gen: Well NAD HEENT: EOMI,  MMM Lungs: Normal work of breathing. CTABL Heart: RRR no MRG Abd: NABS, Soft. Nondistended, Nontender Exts: Brisk capillary refill, warm and well perfused.    \Right great toe is erythematous and tender at the MTP. Decreased motion due to pain. Capillary refill and sensation are intact distally.  No results found for this or any previous visit (from the past 24 hour(s)). Dg Foot Complete Right  Result Date: 11/13/2015 CLINICAL DATA:  First metatarsal phalangeal joint  pain. EXAM: RIGHT FOOT COMPLETE - 3+ VIEW COMPARISON:  None. FINDINGS: There is no evidence of fracture or dislocation. There is no evidence of arthropathy or other focal bone abnormality. Vascular calcifications are noted. IMPRESSION: No significant bony abnormality seen in the right foot. Electronically Signed   By: Marijo Conception, M.D.   On: 11/13/2015 14:58    Assessment and Plan: 70 y.o. male with right great toe pain very concerning for acute gout. Patient has multiple medical problems that are co-managed at the [facility]. I'm not sure about his GFR status. Most recently in July his GFR was 32. The ideal medication to use for acute gout flare is colchicine in the setting. We'll use low-dose colchicine 0.3 mg daily along with low-dose Norco and a postoperative shoe for pain control. Recommend patient follow-up with his PCP very soon. Labs ordered below.  Orders Placed This Encounter  Procedures  . DG Foot Complete Right    Standing Status:   Future    Number of Occurrences:   1    Standing Expiration Date:   01/12/2017    Order Specific Question:   Reason for Exam (SYMPTOM  OR DIAGNOSIS REQUIRED)    Answer:   eval pain 1st MTP. Suspect gout    Order Specific Question:   Preferred imaging location?    Answer:   External  . COMPLETE METABOLIC PANEL WITH GFR  . CBC  . Uric acid     Discussed warning signs or symptoms. Please see discharge instructions. Patient expresses understanding.

## 2015-11-13 NOTE — Patient Instructions (Addendum)
Thank you for coming in today. Return if not better.  Take colchicine 1/2 tablet daily x 7 days.  Return as needed.  Use norco sparingly for pain as needed.  Use the special shoe at home as needed.   Gout Gout is when your joints become red, sore, and swell (inflamed). This is caused by the buildup of uric acid crystals in the joints. Uric acid is a chemical that is normally in the blood. If the level of uric acid gets too high in the blood, these crystals form in your joints and tissues. Over time, these crystals can form into masses near the joints and tissues. These masses can destroy bone and cause the bone to look misshapen (deformed). HOME CARE   Do not take aspirin for pain.  Only take medicine as told by your doctor.  Rest the joint as much as you can. When in bed, keep sheets and blankets off painful areas.  Keep the sore joints raised (elevated).  Put warm or cold packs on painful joints. Use of warm or cold packs depends on which works best for you.  Use crutches if the painful joint is in your leg.  Drink enough fluids to keep your pee (urine) clear or pale yellow. Limit alcohol, sugary drinks, and drinks with fructose in them.  Follow your diet instructions. Pay careful attention to how much protein you eat. Include fruits, vegetables, whole grains, and fat-free or low-fat milk products in your daily diet. Talk to your doctor or dietitian about the use of coffee, vitamin C, and cherries. These may help lower uric acid levels.  Keep a healthy body weight. GET HELP RIGHT AWAY IF:   You have watery poop (diarrhea), throw up (vomit), or have any side effects from medicines.  You do not feel better in 24 hours, or you are getting worse.  Your joint becomes suddenly more tender, and you have chills or a fever. MAKE SURE YOU:   Understand these instructions.  Will watch your condition.  Will get help right away if you are not doing well or get worse.   This  information is not intended to replace advice given to you by your health care provider. Make sure you discuss any questions you have with your health care provider.   Document Released: 10/13/2007 Document Revised: 01/24/2014 Document Reviewed: 08/17/2011 Elsevier Interactive Patient Education 2016 [employer].  Colchicine tablets or capsules What is this medicine? COLCHICINE (KOL chi seen) is for joint pain and swelling due to attacks of acute gouty arthritis. The medicine is also used to treat familial Mediterranean fever. This medicine may be used for other purposes; ask your health care provider or pharmacist if you have questions. What should I tell my health care provider before I take this medicine? They need to know if you have any of these conditions: -anemia -blood disorders like leukemia or lymphoma -heart disease -immune system problems -intestinal disease -kidney disease -liver disease -muscle pain or weakness -take other medicines -stomach problems -an unusual or allergic reaction to colchicine, other medicines, lactose, foods, dyes, or preservatives -pregnant or trying to get pregnant -breast-feeding How should I use this medicine? Take this medicine by mouth with a full glass of water. Follow the directions on the prescription label. You can take it with or without food. If it upsets your stomach, take it with food. Take your medicine at regular intervals. Do not take your medicine more often than directed. A special MedGuide will be given to  you by the pharmacist with each prescription and refill. Be sure to read this information carefully each time. Talk to your pediatrician regarding the use of this medicine in children. While this drug may be prescribed for children as young as 71 years old for selected conditions, precautions do apply. Patients over 37 years old may have a stronger reaction and need a smaller dose. Overdosage: If you think you have taken too much  of this medicine contact a poison control center or emergency room at once. NOTE: This medicine is only for you. Do not share this medicine with others. What if I miss a dose? If you miss a dose, take it as soon as you can. If it is almost time for your next dose, take only that dose. Do not take double or extra doses. What may interact with this medicine? Do not take this medicine with any of the following medications: -certain medicines for fungal infections like itraconazole This medicine may also interact with the following medications: -alcohol -certain medicines for cholesterol like atorvastatin -certain medicines for coughs and colds -certain medicines to help you breathe better -cyclosporine -digoxin -epinephrine -grapefruit or grapefruit juice -methenamine -other medicines for fungal infection -sodium bicarbonate -some antibiotics like clarithromycin, erythromycin, and telithromycin -some medicines for an irregular heartbeat or other heart problems -some medicines for cancer, like lapatinib and tamoxifen -some medicines for HIV This list may not describe all possible interactions. Give your health care provider a list of all the medicines, herbs, non-prescription drugs, or dietary supplements you use. Also tell them if you smoke, drink alcohol, or use illegal drugs. Some items may interact with your medicine. What should I watch for while using this medicine? Visit your doctor or health care professional for regular checks on your progress. You may need periodic blood checks. Alcohol can increase the chance of getting stomach problems and gout attacks. Do not drink alcohol. What side effects may I notice from receiving this medicine? Side effects that you should report to your doctor or health care professional as soon as possible: -allergic reactions like skin rash, itching or hives, swelling of the face, lips, or tongue -fever, chills, or sore throat -muscle tenderness,  pain, or weakness -numbness or tingling in hands or feet -unusual bleeding or bruising -unusually weak or tired -vomiting Side effects that usually do not require medical attention (report to your doctor or health care professional if they continue or are bothersome): -diarrhea -hair loss -loss of appetite -stomach pain or nausea This list may not describe all possible side effects. Call your doctor for medical advice about side effects. You may report side effects to FDA at 1-800-FDA-1088. Where should I keep my medicine? Keep out of the reach of children. Store at room temperature between 15 and 30 degrees C (59 and 86 degrees F). Keep container tightly closed. Protect from light. Throw away any unused medicine after the expiration date. NOTE: This sheet is a summary. It may not cover all possible information. If you have questions about this medicine, talk to your doctor, pharmacist, or health care provider.    2016, Elsevier/Gold Standard. (2012-07-02 16:48:38)      IF you received an x-ray today, you will receive an invoice from [facility]. Please contact [facility] at [phone] with questions or concerns regarding your invoice.   IF you received labwork today, you will receive an invoice from [employer]. Please contact [facility] at [phone] with questions or concerns regarding your invoice.  Our billing staff will not be able to assist you with questions regarding bills from these companies.  You will be contacted with the lab results as soon as they are available. The fastest way to get your results is to activate your My Chart account. Instructions are located on the last page of this paperwork. If you have not heard from [place] regarding the results in 2 weeks, please contact this office.

## 2015-11-14 LAB — COMPLETE METABOLIC PANEL WITH GFR
ALBUMIN: 4.6 g/dL (ref 3.6–5.1)
ALT: 43 U/L (ref 9–46)
AST: 27 U/L (ref 10–35)
Alkaline Phosphatase: 88 U/L (ref 40–115)
BILIRUBIN TOTAL: 0.4 mg/dL (ref 0.2–1.2)
BUN: 18 mg/dL (ref 7–25)
CALCIUM: 9.4 mg/dL (ref 8.6–10.3)
CHLORIDE: 106 mmol/L (ref 98–110)
CO2: 25 mmol/L (ref 20–31)
CREATININE: 1.58 mg/dL — AB (ref 0.70–1.25)
GFR, Est African American: 51 mL/min — ABNORMAL LOW (ref >=60)
GFR, Est Non African American: 44 mL/min — ABNORMAL LOW (ref >=60)
Glucose, Bld: 109 mg/dL — ABNORMAL HIGH (ref 65–99)
Potassium: 3.9 mmol/L (ref 3.5–5.3)
Sodium: 143 mmol/L (ref 135–146)
TOTAL PROTEIN: 7.6 g/dL (ref 6.1–8.1)

## 2015-11-14 LAB — URIC ACID: Uric Acid, Serum: 7.7 mg/dL (ref 4.0–8.0)

## 2015-11-16 ENCOUNTER — Encounter: Payer: Self-pay | Admitting: Family Medicine

## 2015-11-16 DIAGNOSIS — M1030 Gout due to renal impairment, unspecified site: Secondary | ICD-10-CM | POA: Insufficient documentation

## 2016-06-24 DIAGNOSIS — F4310 Post-traumatic stress disorder, unspecified: Secondary | ICD-10-CM | POA: Insufficient documentation

## 2016-06-24 DIAGNOSIS — B182 Chronic viral hepatitis C: Secondary | ICD-10-CM | POA: Insufficient documentation

## 2016-07-16 DIAGNOSIS — M25559 Pain in unspecified hip: Secondary | ICD-10-CM | POA: Insufficient documentation

## 2016-08-02 DIAGNOSIS — R399 Unspecified symptoms and signs involving the genitourinary system: Secondary | ICD-10-CM | POA: Insufficient documentation

## 2016-10-20 ENCOUNTER — Ambulatory Visit
Admission: RE | Admit: 2016-10-20 | Discharge: 2016-10-20 | Disposition: A | Discharge status: Home / Self Care | Payer: 59 | Source: Ambulatory Visit | Attending: Internal Medicine | Admitting: Internal Medicine

## 2016-10-20 ENCOUNTER — Other Ambulatory Visit: Payer: Self-pay | Admitting: Internal Medicine

## 2016-10-20 DIAGNOSIS — R059 Cough, unspecified: Secondary | ICD-10-CM

## 2016-10-20 DIAGNOSIS — R05 Cough: Secondary | ICD-10-CM

## 2016-10-20 DIAGNOSIS — R509 Fever, unspecified: Secondary | ICD-10-CM

## 2017-01-30 ENCOUNTER — Encounter (HOSPITAL_COMMUNITY): Payer: Self-pay | Admitting: Emergency Medicine

## 2017-01-30 ENCOUNTER — Ambulatory Visit (HOSPITAL_COMMUNITY)
Admission: EM | Admit: 2017-01-30 | Discharge: 2017-01-30 | Disposition: A | Discharge status: Home / Self Care | Payer: 59 | Attending: Internal Medicine | Admitting: Internal Medicine

## 2017-01-30 DIAGNOSIS — M109 Gout, unspecified: Secondary | ICD-10-CM

## 2017-01-30 DIAGNOSIS — M79641 Pain in right hand: Secondary | ICD-10-CM | POA: Diagnosis not present

## 2017-01-30 MED ORDER — PREDNISONE 10 MG (21) PO TBPK: ORAL_TABLET | Freq: Every day | ORAL | 0 refills | Status: DC

## 2017-01-30 NOTE — Discharge Instructions (Signed)
Ice, elevate. Prednisone pack as prescribed. Drink plenty of water.  See low-purine diet provided. If develop increased pain, redness, fevers, numbness, tingling or worsening of symptoms, or if no improvement in the next week return to be seen or follow with your primary care provider.

## 2017-01-30 NOTE — ED Triage Notes (Signed)
PT reports right hand pain that started Friday. PT has history of gout.

## 2017-01-30 NOTE — ED Provider Notes (Signed)
[place]    CSN: [id number] Arrival date & time: 01/30/17  1000     History   Chief Complaint Chief Complaint  Patient presents with  . Hand Pain    HPI Marcus Montgomery is a 72 y.o. male.   Marcus Montgomery presents with complaints of right hand and wrist pain which started 1/11 but worsened yesterday. Has a history of gout but has not had it to hand ever. Saw pcp on 1/7 because he had foot pain which he felt would develop into gout but this has resolved. He is right handed. Without injury to hand. Without fevers. Does not drink alcohol. States he has not necessarily been drinking much water recently. History of CKD, CAD, htn. Takes benicar, metoprolol, aspirin and lipitor. Hydrocodone PRN for back pain. Any movement of the hand or touching causes pain. Rates pain 10/10. Has not taken any medications for his pain.    ROS per HPI.       Past Medical History:  Diagnosis Date  . Alcoholism ([place])   . Anemia   . Chronic kidney disease (CKD), stage III (moderate) (HCC)   . Chronic lower back pain   . Coronary artery disease    a Progressive angina 07/[facility] showing 80% RCA, distal left main 20%, proximal LAD 50%, mid LAD 40%, circumflex 20%) -> s/p PCI/DES to RCA 07/28/15 (50% mRCA, 80% mRCA).  . Depression   . GERD (gastroesophageal reflux disease)   . Hepatitis C    as per liver biopsy  . High cholesterol   . History of hiatal hernia   . Hyperplastic colon polyp   . Hypertension   . Leg pain, bilateral 2014  . Pneumonia 1970s X 1    Patient Active Problem List   Diagnosis Date Noted  . Acute gout due to renal impairment 11/16/2015  . Essential hypertension 07/29/2015  . Normocytic anemia 07/29/2015  . Unstable angina ([place])   . Abnormal nuclear stress test   . CAD (coronary artery disease) 07/24/2015  . Hyperlipidemia 07/24/2015  . CKD (chronic kidney disease) stage 3, GFR 30-59 ml/min (HCC) 07/24/2015  . Numbness-Bilateral leg 06/21/2012  .  Weakness-Bilateral leg 06/21/2012  . Pain in limb-Bilateral leg 06/21/2012  . Peripheral vascular disease, unspecified ([place]) 06/21/2012    Past Surgical History:  Procedure Laterality Date  . CARDIAC CATHETERIZATION  06/2015  . CARDIAC CATHETERIZATION N/A 07/28/2015   Procedure: Coronary Stent Intervention;  Surgeon: Burnell Blanks, MD;  Location: [facility];  Service: Cardiovascular;  Laterality: N/A;  . COLONOSCOPY W/ BIOPSIES AND POLYPECTOMY    . CORONARY ANGIOPLASTY WITH STENT PLACEMENT  07/28/2015   "1 stent"  . ESOPHAGOGASTRODUODENOSCOPY    . FOOT SURGERY Left X 9   "bone spurs, bruised bones, etc."       Home Medications    Prior to Admission medications   Medication Sig Start Date End Date Taking? Authorizing Provider  aspirin 81 MG tablet Take 81 mg by mouth daily.   Yes [provider]  atorvastatin (LIPITOR) 40 MG tablet Take 40 mg by mouth daily. 07/19/15  Yes [provider]  metoprolol tartrate (LOPRESSOR) 25 MG tablet Take 12.5 mg by mouth 2 (two) times daily.   Yes [provider]  olmesartan-hydrochlorothiazide (BENICAR HCT) 40-12.5 MG tablet Take 1 tablet by mouth daily.   Yes [provider]  clopidogrel (PLAVIX) 75 MG tablet Take 75 mg by mouth daily. 07/13/15   [provider]  colchicine 0.6 MG tablet Take 0.5 tablets (0.3 mg total) by mouth daily. 11/13/15   Gregor Hams, MD  HYDROcodone-acetaminophen (NORCO/VICODIN) 5-325 MG tablet Take 1 tablet by mouth 3 (three) times daily as needed for moderate pain. 11/13/15   Gregor Hams, MD  isosorbide mononitrate (IMDUR) 30 MG 24 hr tablet Take 30 mg by mouth daily. 07/02/15   [provider]  Multiple Vitamin (MULTIVITAMIN) tablet Take 1 tablet by mouth daily.    [provider]  nitroGLYCERIN (NITROSTAT) 0.4 MG SL tablet Place 1 tablet (0.4 mg total) under the tongue every 5 (five) minutes as needed for chest pain (up to 3 doses). 07/29/15    Dunn, Nedra Hai, PA-C  predniSONE (STERAPRED UNI-PAK 21 TAB) 10 MG (21) TBPK tablet Take by mouth daily. Take 6 tabs by mouth daily  for 2 days, then 5 tabs for 2 days, then 4 tabs for 2 days, then 3 tabs for 2 days, 2 tabs for 2 days, then 1 tab by mouth daily for 2 days 01/30/17   Zigmund Gottron, NP    Family History Family History  Problem Relation Age of Onset  . Heart disease Father   . Hypertension Father   . Heart attack Father   . Cancer Mother   . Cancer Sister   . Heart disease Brother   . Hypertension Brother   . Heart attack Brother   . Colon cancer Neg Hx     Social History Social History   Tobacco Use  . Smoking status: Former Smoker    Packs/day: 1.00    Years: 30.00    Pack years: 30.00    Types: Cigarettes  . Smokeless tobacco: Never Used  . Tobacco comment: "quit smoking cigarettes in ~ 1995"  Substance Use Topics  . Alcohol use: Yes    Comment: 07/28/2015 "stopped drinking in ~ 1995"  . Drug use: No     Allergies   Demerol [meperidine]   Review of Systems Review of Systems   Physical Exam Triage Vital Signs ED Triage Vitals [01/30/17 1015]  Enc Vitals Group     BP 132/82     Pulse Rate 63     Resp 16     Temp 100 F (37.8 C)     Temp Source Oral     SpO2 98 %     Weight 180 lb (81.6 kg)     Height      Head Circumference      Peak Flow      Pain Score 10     Pain Loc      Pain Edu?      Excl. in [place]?    No data found.  Updated Vital Signs BP 132/82   Pulse 63   Temp 100 F (37.8 C) (Oral)   Resp 16   Wt 180 lb (81.6 kg)   SpO2 98%   BMI 26.58 kg/m   Visual Acuity Right Eye Distance:   Left Eye Distance:   Bilateral Distance:    Right Eye Near:   Left Eye Near:    Bilateral Near:     Physical Exam  Constitutional: He is oriented to person, place, and time. He appears well-developed and well-nourished.  Cardiovascular: Normal rate and regular rhythm.  Pulmonary/Chest: Effort normal and breath sounds normal.    Musculoskeletal:       Right hand: He exhibits decreased range of motion, tenderness, bony tenderness and swelling. He exhibits normal two-point discrimination,  normal capillary refill, no deformity and no laceration. Normal sensation noted. Decreased strength noted.       Hands: Redness and swelling noted to wrist and extending into proximal hand; patient with generalized tenderness to this area as well as mild tenderness to all fingers; pain with any wrist flexion or extension; fingers ROM intact but with pain related to swelling; cap refill <2 sec; skin soft without sign of infectious process or abscess   Neurological: He is alert and oriented to person, place, and time.  Skin: Skin is warm and dry.     UC Treatments / Results  Labs (all labs ordered are listed, but only abnormal results are displayed) Labs Reviewed - No data to display  EKG  EKG Interpretation None       Radiology No results found.  Procedures Procedures (including critical care time)  Medications Ordered in UC Medications - No data to display   Initial Impression / Assessment and Plan / UC Course  I have reviewed the triage vital signs and the nursing notes.  Pertinent labs & imaging results that were available during my care of the patient were reviewed by me and considered in my medical decision making (see chart for details).     Prednisone course for likely gout. Ice, elevate. Push fluids. Return precautions provided. Patient verbalized understanding and agreeable to plan.    Final Clinical Impressions(s) / UC Diagnoses   Final diagnoses:  Hand pain, right  Acute gout of right hand, unspecified cause    ED Discharge Orders        Ordered    predniSONE (STERAPRED UNI-PAK 21 TAB) 10 MG (21) TBPK tablet  Daily     01/30/17 1026       Controlled Substance Prescriptions [place] Controlled Substance Registry consulted? Not Applicable   Zigmund Gottron, NP 01/30/17 1035

## 2017-02-02 ENCOUNTER — Ambulatory Visit: Payer: PRIVATE HEALTH INSURANCE | Admitting: Podiatry

## 2017-05-22 ENCOUNTER — Other Ambulatory Visit: Payer: Self-pay | Admitting: Internal Medicine

## 2017-05-22 DIAGNOSIS — Z0001 Encounter for general adult medical examination with abnormal findings: Secondary | ICD-10-CM

## 2017-06-01 ENCOUNTER — Ambulatory Visit
Admission: RE | Admit: 2017-06-01 | Discharge: 2017-06-01 | Disposition: A | Discharge status: Home / Self Care | Payer: 59 | Source: Ambulatory Visit | Attending: Internal Medicine | Admitting: Internal Medicine

## 2017-06-01 DIAGNOSIS — Z0001 Encounter for general adult medical examination with abnormal findings: Secondary | ICD-10-CM

## 2017-06-06 ENCOUNTER — Other Ambulatory Visit: Payer: Self-pay | Admitting: Internal Medicine

## 2017-06-06 ENCOUNTER — Ambulatory Visit
Admission: RE | Admit: 2017-06-06 | Discharge: 2017-06-06 | Disposition: A | Discharge status: Home / Self Care | Payer: 59 | Source: Ambulatory Visit | Attending: Internal Medicine | Admitting: Internal Medicine

## 2017-06-06 DIAGNOSIS — J208 Acute bronchitis due to other specified organisms: Secondary | ICD-10-CM

## 2017-06-12 ENCOUNTER — Encounter (HOSPITAL_COMMUNITY): Payer: Self-pay | Admitting: Family Medicine

## 2017-06-12 ENCOUNTER — Ambulatory Visit (HOSPITAL_COMMUNITY)
Admission: EM | Admit: 2017-06-12 | Discharge: 2017-06-12 | Disposition: A | Discharge status: Home / Self Care | Payer: 59 | Attending: Family Medicine | Admitting: Family Medicine

## 2017-06-12 DIAGNOSIS — M10371 Gout due to renal impairment, right ankle and foot: Secondary | ICD-10-CM | POA: Diagnosis not present

## 2017-06-12 MED ORDER — PREDNISONE 10 MG (21) PO TBPK: ORAL_TABLET | ORAL | 0 refills | Status: DC

## 2017-06-12 MED ORDER — TRAMADOL HCL 50 MG PO TABS: 50.0000 mg | ORAL_TABLET | Freq: Four times a day (QID) | ORAL | 0 refills | Status: DC | PRN

## 2017-06-12 NOTE — ED Triage Notes (Signed)
Pt with hx of gout here for pain in the right foot since Friday. Denies injury.

## 2017-06-12 NOTE — ED Provider Notes (Signed)
[place]    CSN: [id number] Arrival date & time: 06/12/17  1003     History   Chief Complaint Chief Complaint  Patient presents with  . Gout    HPI Marcus Montgomery is a 72 y.o. male veteran with a history of CAD s/p PCI, stage III CKD, previous but not recent alcohol abuse, GERD and recurrent gout flares here with 3 days of constant, worsening right ankle pain that is now severe, worse with weight bearing. Some swelling in the joint as well, similar to prior episodes of gout. No injury/trauma/inversion/eversion injuries. Noticed it as he woke up 3 days ago and has tried nothing specifically for this.   HPI  Past Medical History:  Diagnosis Date  . Alcoholism ([place])   . Anemia   . Chronic kidney disease (CKD), stage III (moderate) (HCC)   . Chronic lower back pain   . Coronary artery disease    a Progressive [facility] showing 80% RCA, distal left main 20%, proximal LAD 50%, mid LAD 40%, circumflex 20%) -> s/p PCI/DES to RCA 07/28/15 (50% mRCA, 80% mRCA).  . Depression   . GERD (gastroesophageal reflux disease)   . Hepatitis C    as per liver biopsy  . High cholesterol   . History of hiatal hernia   . Hyperplastic colon polyp   . Hypertension   . Leg pain, bilateral 2014  . Pneumonia 1970s X 1    Patient Active Problem List   Diagnosis Date Noted  . Acute gout due to renal impairment 11/16/2015  . Essential hypertension 07/29/2015  . Normocytic anemia 07/29/2015  . Unstable angina ([place])   . Abnormal nuclear stress test   . CAD (coronary artery disease) 07/24/2015  . Hyperlipidemia 07/24/2015  . CKD (chronic kidney disease) stage 3, GFR 30-59 ml/min (HCC) 07/24/2015  . Numbness-Bilateral leg 06/21/2012  . Weakness-Bilateral leg 06/21/2012  . Pain in limb-Bilateral leg 06/21/2012  . Peripheral vascular disease, unspecified ([place]) 06/21/2012    Past Surgical History:  Procedure Laterality Date  . CARDIAC CATHETERIZATION  06/2015  .  CARDIAC CATHETERIZATION N/A 07/28/2015   Procedure: Coronary Stent Intervention;  Surgeon: Burnell Blanks, MD;  Location: [facility];  Service: Cardiovascular;  Laterality: N/A;  . COLONOSCOPY W/ BIOPSIES AND POLYPECTOMY    . CORONARY ANGIOPLASTY WITH STENT PLACEMENT  07/28/2015   "1 stent"  . ESOPHAGOGASTRODUODENOSCOPY    . FOOT SURGERY Left X 9   "bone spurs, bruised bones, etc."       Home Medications    Prior to Admission medications   Medication Sig Start Date End Date Taking? Authorizing Provider  aspirin 81 MG tablet Take 81 mg by mouth daily.    [provider]  atorvastatin (LIPITOR) 40 MG tablet Take 40 mg by mouth daily. 07/19/15   [provider]  clopidogrel (PLAVIX) 75 MG tablet Take 75 mg by mouth daily. 07/13/15   [provider]  colchicine 0.6 MG tablet Take 0.5 tablets (0.3 mg total) by mouth daily. 11/13/15   Gregor Hams, MD  HYDROcodone-acetaminophen (NORCO/VICODIN) 5-325 MG tablet Take 1 tablet by mouth 3 (three) times daily as needed for moderate pain. 11/13/15   Gregor Hams, MD  isosorbide mononitrate (IMDUR) 30 MG 24 hr tablet Take 30 mg by mouth daily. 07/02/15   [provider]  metoprolol tartrate (LOPRESSOR) 25 MG tablet Take 12.5 mg by mouth 2 (two) times daily.    [provider]  Multiple Vitamin (MULTIVITAMIN) tablet Take 1 tablet by mouth daily.    [provider]  nitroGLYCERIN (NITROSTAT) 0.4 MG SL tablet Place 1 tablet (0.4 mg total) under the tongue every 5 (five) minutes as needed for chest pain (up to 3 doses). 07/29/15   Dunn, Nedra Hai, PA-C  olmesartan-hydrochlorothiazide (BENICAR HCT) 40-12.5 MG tablet Take 1 tablet by mouth daily.    [provider]  predniSONE (STERAPRED UNI-PAK 21 TAB) 10 MG (21) TBPK tablet Take 6 tabs by mouth daily  for 2 days, then 5 tabs for 2 days, then 4 tabs for 2 days, then 3 tabs for 2 days, 2 tabs for 2 days, then 1 tab by mouth daily for 2  days 06/12/17   Patrecia Pour, MD  traMADol (ULTRAM) 50 MG tablet Take 1 tablet (50 mg total) by mouth every 6 (six) hours as needed. 06/12/17   Patrecia Pour, MD    Family History Family History  Problem Relation Age of Onset  . Heart disease Father   . Hypertension Father   . Heart attack Father   . Cancer Mother   . Cancer Sister   . Heart disease Brother   . Hypertension Brother   . Heart attack Brother   . Colon cancer Neg Hx     Social History Social History   Tobacco Use  . Smoking status: Former Smoker    Packs/day: 1.00    Years: 30.00    Pack years: 30.00    Types: Cigarettes  . Smokeless tobacco: Never Used  . Tobacco comment: "quit smoking cigarettes in ~ 1995"  Substance Use Topics  . Alcohol use: Yes    Comment: 07/28/2015 "stopped drinking in ~ 1995"  . Drug use: No     Allergies   Demerol [meperidine]   Review of Systems Review of Systems   Physical Exam Triage Vital Signs ED Triage Vitals [06/12/17 1051]  Enc Vitals Group     BP (!) 141/79     Pulse Rate (!) 50     Resp 18     Temp 98.6 F (37 C)     Temp src      SpO2 100 %     Weight      Height      Head Circumference      Peak Flow      Pain Score      Pain Loc      Pain Edu?      Excl. in [place]?    No data found.  Updated Vital Signs BP (!) 141/79   Pulse (!) 50   Temp 98.6 F (37 C)   Resp 18   SpO2 100%   Visual Acuity Right Eye Distance:   Left Eye Distance:   Bilateral Distance:    Right Eye Near:   Left Eye Near:    Bilateral Near:     Physical Exam Right ankle: Mild swelling compared to left with tenderness to palpation posteriorly. No palpable deformity, ecchymoses. Dorsiflexion/plantarflexion intact.   UC Treatments / Results  Labs (all labs ordered are listed, but only abnormal results are displayed) Labs Reviewed - No data to display  EKG None  Radiology No results found.  Procedures Procedures (including critical care time)  Medications  Ordered in UC Medications - No data to display  Initial Impression / Assessment and Plan / UC Course  I have reviewed the triage vital signs and the nursing notes.  Pertinent labs & imaging results that were available during my care of the patient were reviewed by me and considered in my medical decision making (see chart for details).     72 y.o. male with a history of multicple comorbidities including CAD s/p PCI and possibly progressing CKD which seems to be causing increased frequency of gout flares. No h/o DM, but does have HTN, BP 141/79 here. Risk of colchicine with unknown GFR and NSAIDs with known CKD make steroids a better choice. Question whether CKD is progressing and this is cause of recurrent gouty attacks. - Prednisone taper - Ice/elevate, hydrate  - Consider alternative agent to HCTZ in treatment of HTN - Follow up with PCP to initiate ULT and recheck progress after steroid burst.  Final Clinical Impressions(s) / UC Diagnoses   Final diagnoses:  Acute gout due to renal impairment involving right ankle    ED Prescriptions    Medication Sig Dispense Auth. Provider   predniSONE (STERAPRED UNI-PAK 21 TAB) 10 MG (21) TBPK tablet Take 6 tabs by mouth daily  for 2 days, then 5 tabs for 2 days, then 4 tabs for 2 days, then 3 tabs for 2 days, 2 tabs for 2 days, then 1 tab by mouth daily for 2 days 42 tablet Patrecia Pour, MD   traMADol (ULTRAM) 50 MG tablet Take 1 tablet (50 mg total) by mouth every 6 (six) hours as needed. 12 tablet Patrecia Pour, MD       Patrecia Pour, MD 06/12/17 1131

## 2017-12-29 ENCOUNTER — Other Ambulatory Visit: Payer: Self-pay | Admitting: Internal Medicine

## 2017-12-29 DIAGNOSIS — N183 Chronic kidney disease, stage 3 unspecified: Secondary | ICD-10-CM

## 2018-01-04 ENCOUNTER — Ambulatory Visit
Admission: RE | Admit: 2018-01-04 | Discharge: 2018-01-04 | Disposition: A | Discharge status: Home / Self Care | Payer: 59 | Source: Ambulatory Visit | Attending: Internal Medicine | Admitting: Internal Medicine

## 2018-01-04 DIAGNOSIS — N183 Chronic kidney disease, stage 3 unspecified: Secondary | ICD-10-CM

## 2018-01-08 ENCOUNTER — Other Ambulatory Visit: Payer: Self-pay | Admitting: Internal Medicine

## 2018-01-09 ENCOUNTER — Other Ambulatory Visit: Payer: Self-pay | Admitting: Internal Medicine

## 2018-01-09 DIAGNOSIS — N289 Disorder of kidney and ureter, unspecified: Secondary | ICD-10-CM

## 2018-02-02 ENCOUNTER — Other Ambulatory Visit: Payer: 59

## 2018-02-11 ENCOUNTER — Ambulatory Visit
Admission: RE | Admit: 2018-02-11 | Discharge: 2018-02-11 | Disposition: A | Discharge status: Home / Self Care | Payer: 59 | Source: Ambulatory Visit | Attending: Internal Medicine | Admitting: Internal Medicine

## 2018-02-11 ENCOUNTER — Other Ambulatory Visit: Payer: Self-pay | Admitting: Internal Medicine

## 2018-02-11 DIAGNOSIS — N289 Disorder of kidney and ureter, unspecified: Secondary | ICD-10-CM

## 2018-05-25 ENCOUNTER — Ambulatory Visit (INDEPENDENT_AMBULATORY_CARE_PROVIDER_SITE_OTHER): Payer: PRIVATE HEALTH INSURANCE | Admitting: Podiatry

## 2018-05-25 ENCOUNTER — Encounter: Payer: Self-pay | Admitting: Podiatry

## 2018-05-25 ENCOUNTER — Other Ambulatory Visit: Payer: Self-pay

## 2018-05-25 VITALS — Temp 97.5°F

## 2018-05-25 DIAGNOSIS — L03032 Cellulitis of left toe: Secondary | ICD-10-CM

## 2018-05-25 NOTE — Progress Notes (Signed)
Subjective:   Patient ID: Marcus Montgomery, male   DOB: 73 y.o.   MRN: [id number]   HPI Patient presents stating the left great toe had some drainage and I am concerned because I been in relatively poor health.  Patient states that he has had issues with heart and kidney.  Patient does not smoke and likes to be active   Review of Systems  All other systems reviewed and are negative.       Objective:  Physical Exam Vitals signs and nursing note reviewed.  Constitutional:      Appearance: He is well-developed.  Pulmonary:     Effort: Pulmonary effort is normal.  Musculoskeletal: Normal range of motion.  Skin:    General: Skin is warm.  Neurological:     Mental Status: He is alert.     Neurovascular status is intact with patient's left foot lateral border showing redness and slight localized drainage with no proximal edema erythema noted.  Patient did not have any proximal issues that I could ascertain or systemic signs of infection and did have adequate pulses but light but states that he does not have any claudication symptoms     Assessment:  Localized paronychia infection left hallux lateral border with no indication of proximal spread with mild vascular disease     Plan:  H&P reviewed condition I recommended a very conservative approach.  Today I infiltrated the left hallux 60 mg like Marcaine mixture sterile prep applied to the toe and using sterile instrumentation I removed a small amount of the lateral border removed some proud flesh abscess tissue and  flush the area.  Patient had sterile dressing applied was instructed to wear this 24 hours but to take it off earlier if throbbing were to occur and this should be an uneventful healing and is given strict instructions to call me or come in if any issues should occur

## 2018-05-25 NOTE — Patient Instructions (Signed)

## 2018-07-27 ENCOUNTER — Encounter: Payer: Self-pay | Admitting: Gastroenterology

## 2018-07-30 ENCOUNTER — Telehealth: Payer: Self-pay | Admitting: Cardiovascular Disease

## 2018-07-30 NOTE — Telephone Encounter (Signed)
°  Patient called the office and stated he gets Cardiac care from the [place], and no longer needs to be seen by [place]. He states he only saw a Tax adviser when the [place] had extended waiting times.

## 2018-08-22 ENCOUNTER — Other Ambulatory Visit: Payer: Self-pay

## 2018-08-22 ENCOUNTER — Ambulatory Visit (AMBULATORY_SURGERY_CENTER): Payer: Self-pay | Admitting: *Deleted

## 2018-08-22 VITALS — Temp 96.8°F | Ht 69.0 in | Wt 178.0 lb

## 2018-08-22 DIAGNOSIS — Z1211 Encounter for screening for malignant neoplasm of colon: Secondary | ICD-10-CM

## 2018-08-22 MED ORDER — NA SULFATE-K SULFATE-MG SULF 17.5-3.13-1.6 GM/177ML PO SOLN
ORAL | 0 refills | Status: DC
Start: 2018-08-22 — End: 2018-09-05

## 2018-08-22 NOTE — Progress Notes (Signed)
Patient is here for PV today. Patient denies any allergies to eggs or soy. Patient denies any problems with anesthesia/sedation. Patient denies any oxygen use at home. Patient denies taking any diet/weight loss medications or blood thinners. EMMI education assisgned to patient on colonoscopy, this was explained and instructions given to patient.Pt is aware that care partner will wait in the car during procedure; if they feel like they will be too hot to wait in the car; they may wait in the lobby.  We want them to wear a mask (we do not have any that we can provide them), practice social distancing, and we will check their temperatures when they get here.  I did remind patient that their care partner needs to stay in the parking lot the entire time. Pt will wear mask into building.

## 2018-09-04 ENCOUNTER — Telehealth: Payer: Self-pay

## 2018-09-04 NOTE — Telephone Encounter (Signed)
Covid-19 screening questions   Do you now or have you had a fever in the last 14 days?  Do you have any respiratory symptoms of shortness of breath or cough now or in the last 14 days?  Do you have any family members or close contacts with diagnosed or suspected Covid-19 in the past 14 days?  Have you been tested for Covid-19 and found to be positive?      Unable to reach left message to c/b

## 2018-09-05 ENCOUNTER — Ambulatory Visit (AMBULATORY_SURGERY_CENTER): Payer: 59 | Admitting: Gastroenterology

## 2018-09-05 ENCOUNTER — Other Ambulatory Visit: Payer: Self-pay

## 2018-09-05 ENCOUNTER — Encounter: Payer: Self-pay | Admitting: Gastroenterology

## 2018-09-05 VITALS — BP 130/73 | HR 62 | Temp 96.2°F | Resp 21 | Ht 69.0 in | Wt 178.0 lb

## 2018-09-05 DIAGNOSIS — Z1211 Encounter for screening for malignant neoplasm of colon: Secondary | ICD-10-CM

## 2018-09-05 DIAGNOSIS — D122 Benign neoplasm of ascending colon: Secondary | ICD-10-CM

## 2018-09-05 MED ORDER — SODIUM CHLORIDE 0.9 % IV SOLN: 500.0000 mL | Freq: Once | INTRAVENOUS | Status: DC

## 2018-09-05 NOTE — Progress Notes (Signed)
Report given to PACU, vss 

## 2018-09-05 NOTE — Progress Notes (Signed)
Called to room to assist during endoscopic procedure.  Patient ID and intended procedure confirmed with present staff. Received instructions for my participation in the procedure from the performing physician.  

## 2018-09-05 NOTE — Progress Notes (Signed)
JB- Temp CW- Vitals 

## 2018-09-05 NOTE — Patient Instructions (Signed)
Discharge instructions given. Handout on polyps. Resume previous medications. YOU HAD AN ENDOSCOPIC PROCEDURE TODAY AT THE [facility]:   Refer to the procedure report that was given to you for any specific questions about what was found during the examination.  If the procedure report does not answer your questions, please call your gastroenterologist to clarify.  If you requested that your care partner not be given the details of your procedure findings, then the procedure report has been included in a sealed envelope for you to review at your convenience later.  YOU SHOULD EXPECT: Some feelings of bloating in the abdomen. Passage of more gas than usual.  Walking can help get rid of the air that was put into your GI tract during the procedure and reduce the bloating. If you had a lower endoscopy (such as a colonoscopy or flexible sigmoidoscopy) you may notice spotting of blood in your stool or on the toilet paper. If you underwent a bowel prep for your procedure, you may not have a normal bowel movement for a few days.  Please Note:  You might notice some irritation and congestion in your nose or some drainage.  This is from the oxygen used during your procedure.  There is no need for concern and it should clear up in a day or so.  SYMPTOMS TO REPORT IMMEDIATELY:   Following lower endoscopy (colonoscopy or flexible sigmoidoscopy):  Excessive amounts of blood in the stool  Significant tenderness or worsening of abdominal pains  Swelling of the abdomen that is new, acute  Fever of 100F or higher   For urgent or emergent issues, a gastroenterologist can be reached at any hour by calling [phone].   DIET:  We do recommend a small meal at first, but then you may proceed to your regular diet.  Drink plenty of fluids but you should avoid alcoholic beverages for 24 hours.  ACTIVITY:  You should plan to take it easy for the rest of today and you should NOT DRIVE or use heavy  machinery until tomorrow (because of the sedation medicines used during the test).    FOLLOW UP: Our staff will call the number listed on your records 48-72 hours following your procedure to check on you and address any questions or concerns that you may have regarding the information given to you following your procedure. If we do not reach you, we will leave a message.  We will attempt to reach you two times.  During this call, we will ask if you have developed any symptoms of COVID 19. If you develop any symptoms (ie: fever, flu-like symptoms, shortness of breath, cough etc.) before then, please call [phone].  If you test positive for Covid 19 in the 2 weeks post procedure, please call and report this information to us.    If any biopsies were taken you will be contacted by phone or by letter within the next 1-3 weeks.  Please call us at [phone] if you have not heard about the biopsies in 3 weeks.    SIGNATURES/CONFIDENTIALITY: You and/or your care partner have signed paperwork which will be entered into your electronic medical record.  These signatures attest to the fact that that the information above on your After Visit Summary has been reviewed and is understood.  Full responsibility of the confidentiality of this discharge information lies with you and/or your care-partner. 

## 2018-09-05 NOTE — Op Note (Signed)
[facility] Patient Name: Marcus Montgomery Procedure Date: 09/05/2018 9:43 AM MRN: [id number] Endoscopist: Mallie Mussel L. Loletha Carrow , MD Age: 73 Referring MD:  Date of Birth: February 27, 1945 Gender: Male Account #: [id number] Procedure:                Colonoscopy Indications:              Screening for colorectal malignant neoplasm (no                            polyps 07/2008) Medicines:                Monitored Anesthesia Care Procedure:                Pre-Anesthesia Assessment:                           - Prior to the procedure, a History and Physical                            was performed, and patient medications and                            allergies were reviewed. The patient's tolerance of                            previous anesthesia was also reviewed. The risks                            and benefits of the procedure and the sedation                            options and risks were discussed with the patient.                            All questions were answered, and informed consent                            was obtained. Prior Anticoagulants: The patient has                            taken no previous anticoagulant or antiplatelet                            agents except for aspirin. ASA Grade Assessment: II                            - A patient with mild systemic disease. After                            reviewing the risks and benefits, the patient was                            deemed in satisfactory condition to undergo the  procedure.                           After obtaining informed consent, the colonoscope                            was passed under direct vision. Throughout the                            procedure, the patient's blood pressure, pulse, and                            oxygen saturations were monitored continuously. The                            Colonoscope was introduced through the anus and                             advanced to the the cecum, identified by                            appendiceal orifice and ileocecal valve. The                            colonoscopy was performed without difficulty. The                            patient tolerated the procedure well. The quality                            of the bowel preparation was good. The ileocecal                            valve, appendiceal orifice, and rectum were                            photographed. Scope In: 10:01:23 AM Scope Out: 10:12:39 AM Scope Withdrawal Time: 0 hours 9 minutes 29 seconds  Total Procedure Duration: 0 hours 11 minutes 16 seconds  Findings:                 The perianal and digital rectal examinations were                            normal.                           A 4 mm polyp was found in the proximal ascending                            colon. The polyp was sessile. The polyp was removed                            with a cold snare. Resection and retrieval were  complete.                           The exam was otherwise without abnormality on                            direct and retroflexion views. Complications:            No immediate complications. Estimated Blood Loss:     Estimated blood loss was minimal. Impression:               - One 4 mm polyp in the proximal ascending colon,                            removed with a cold snare. Resected and retrieved.                           - The examination was otherwise normal on direct                            and retroflexion views. Recommendation:           - Patient has a contact number available for                            emergencies. The signs and symptoms of potential                            delayed complications were discussed with the                            patient. Return to normal activities tomorrow.                            Written discharge instructions were provided to the                             patient.                           - Resume previous diet.                           - Continue present medications.                           - Await pathology results.                           - Repeat colonoscopy is recommended for                            surveillance. The colonoscopy date will be                            determined after pathology results from today's  exam become available for review. Henry L. Loletha Carrow, MD 09/05/2018 10:20:13 AM This report has been signed electronically.

## 2018-09-05 NOTE — Progress Notes (Signed)
Pt's states no medical or surgical changes since previsit or office visit. 

## 2018-09-07 ENCOUNTER — Telehealth: Payer: Self-pay | Admitting: *Deleted

## 2018-09-07 NOTE — Telephone Encounter (Signed)
  Follow up Call-  Call back number 09/05/2018  Post procedure Call Back phone  # [phone]  Permission to leave phone message Yes  Some recent data might be hidden     Patient questions:  Do you have a fever, pain , or abdominal swelling? No. Pain Score  0 *  Have you tolerated food without any problems? Yes.    Have you been able to return to your normal activities? Yes.    Do you have any questions about your discharge instructions: Diet   No. Medications  No. Follow up visit  No.  Do you have questions or concerns about your Care? No.  Actions: * If pain score is 4 or above: No action needed, pain <4.  1. Have you developed a fever since your procedure? no  2.   Have you had an respiratory symptoms (SOB or cough) since your procedure? no  3.   Have you tested positive for COVID 19 since your procedure no  4.   Have you had any family members/close contacts diagnosed with the COVID 19 since your procedure?  no   If yes to any of these questions please route to Joylene John, RN and Alphonsa Gin, Therapist, sports.

## 2018-09-11 ENCOUNTER — Encounter: Payer: Self-pay | Admitting: Gastroenterology

## 2019-12-16 IMAGING — US US ABDOMINAL AORTA SCREENING AAA
1 series · 12 of 12 positions shown · non-contrast
Comparison: None.

CLINICAL DATA: Screening study. 71-year-old patient with history of
tobacco use. Initial screening examination

EXAM:
ULTRASOUND OF ABDOMINAL AORTA
TECHNIQUE: Ultrasound examination of the abdominal aorta was performed to
evaluate for abdominal aortic aneurysm.

[Series 1: us abdominal aorta screening aaa · 0.26mm/px · 12 of 12 slices shown]
[im 1/12]
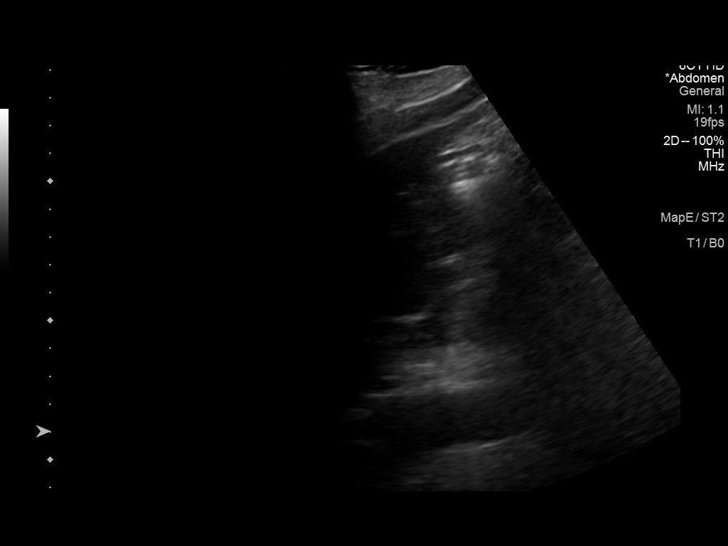
[im 2/12]
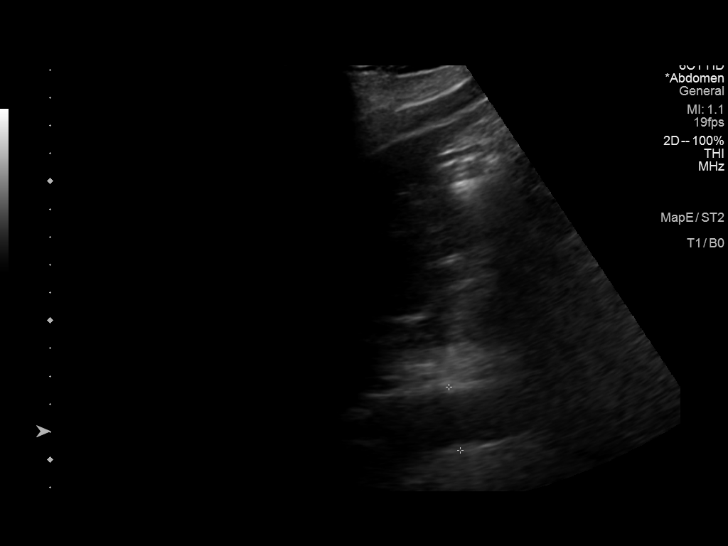
[im 3/12]
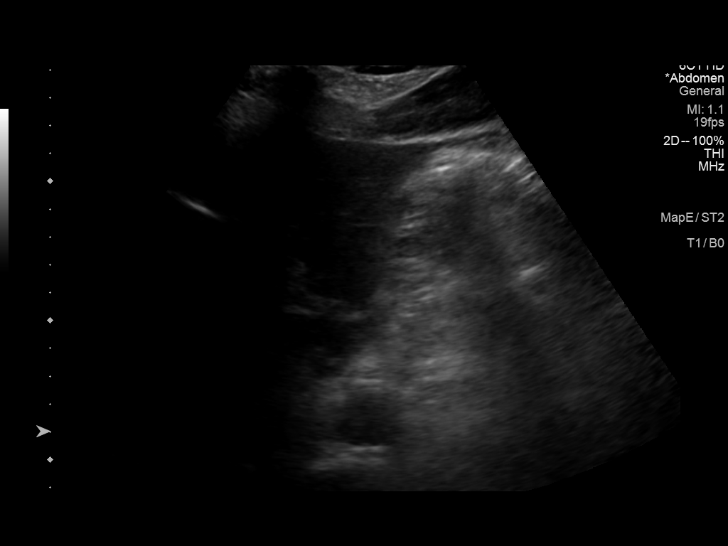
[im 4/12]
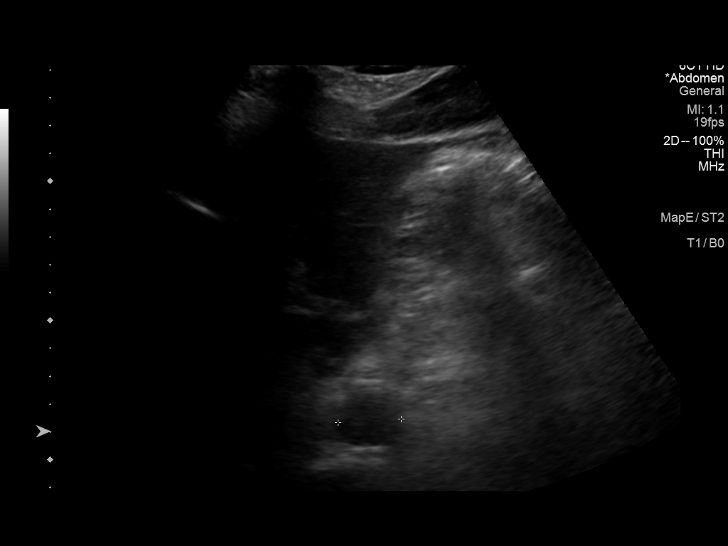
[im 5/12]
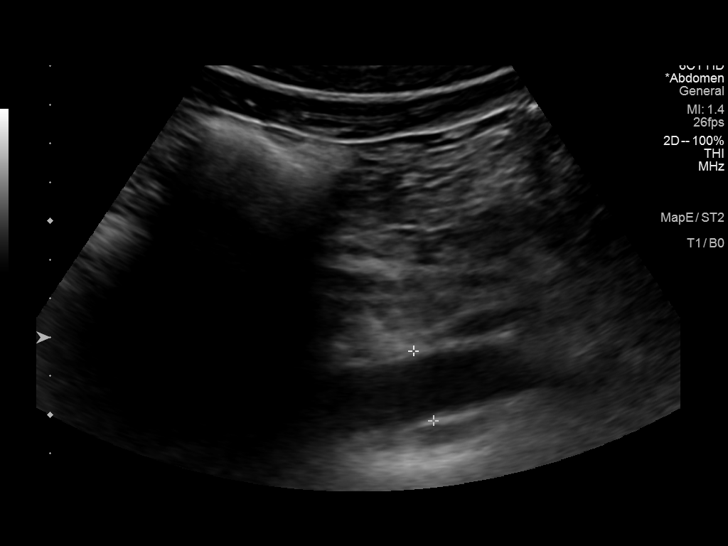
[im 6/12]
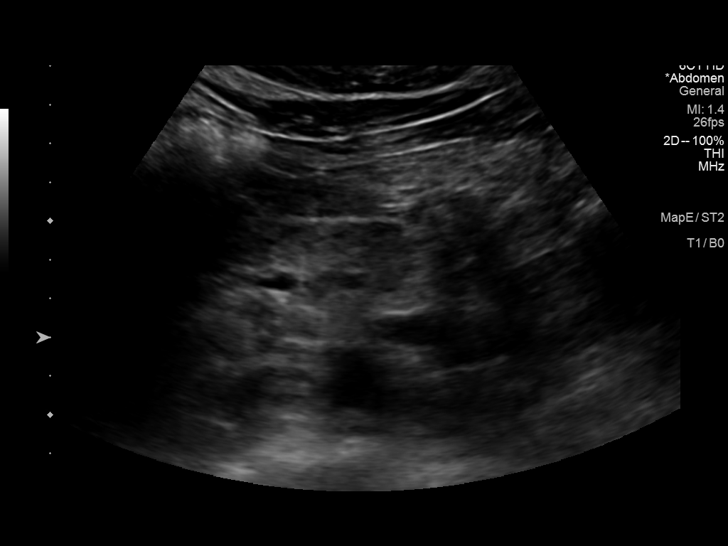
[im 7/12]
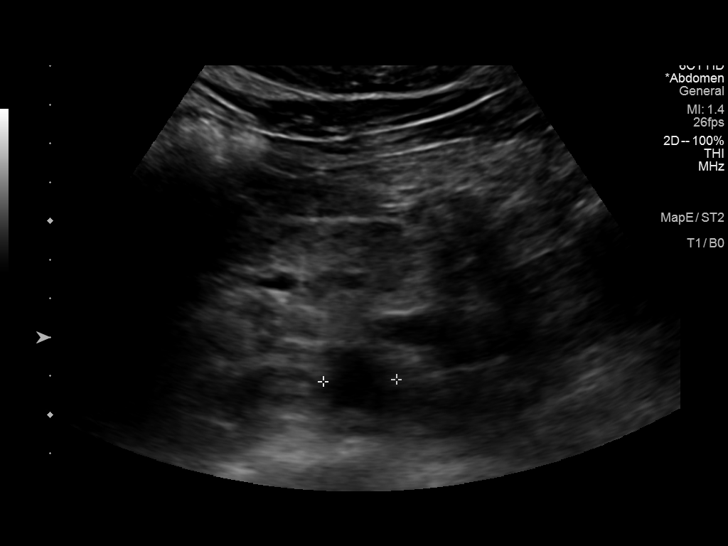
[im 8/12]
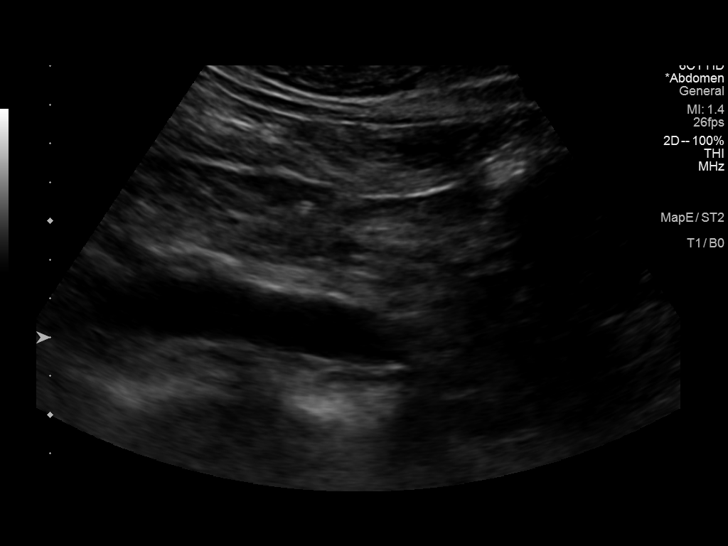
[im 9/12]
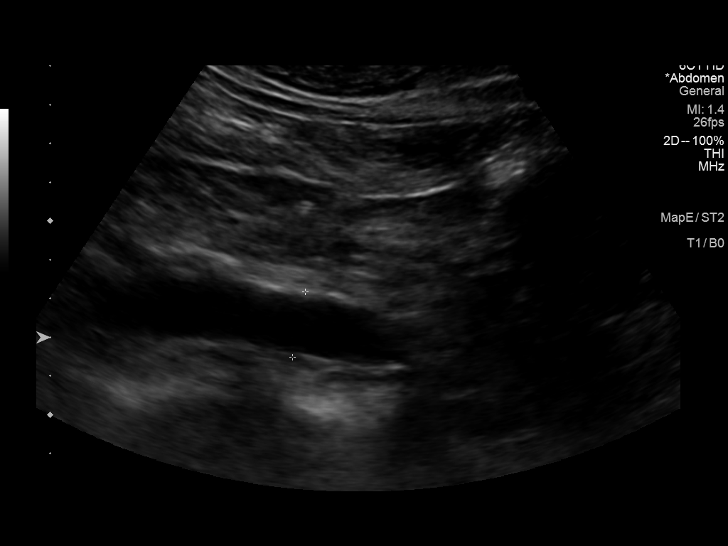
[im 10/12]
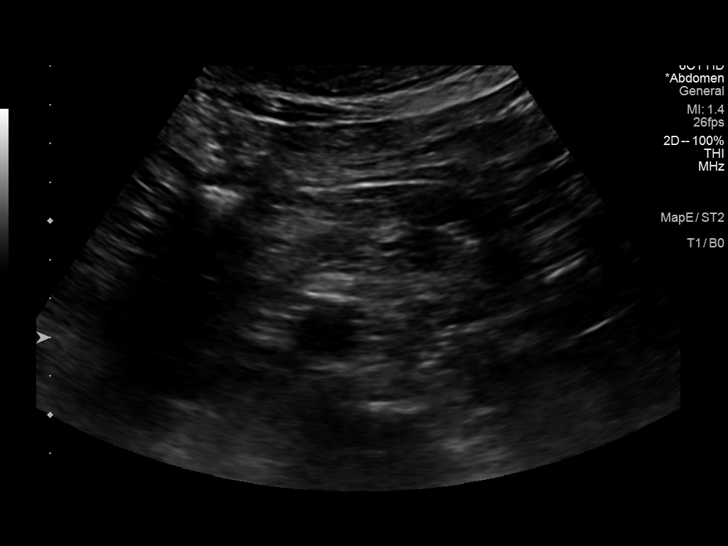
[im 11/12]
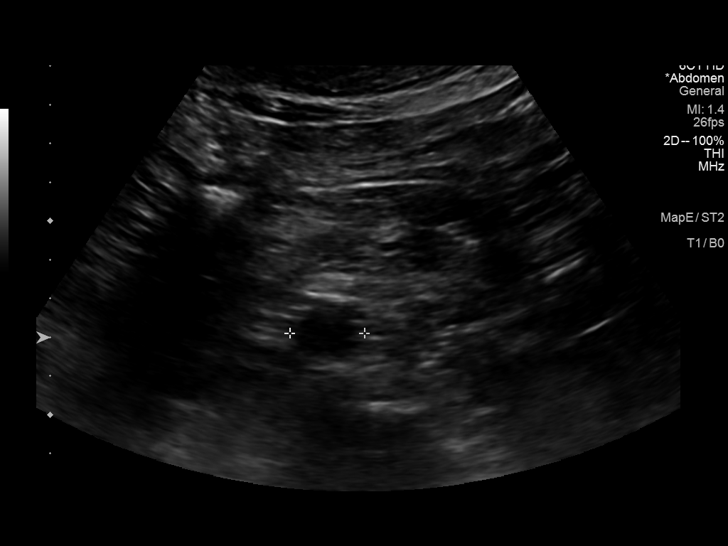
[im 12/12]
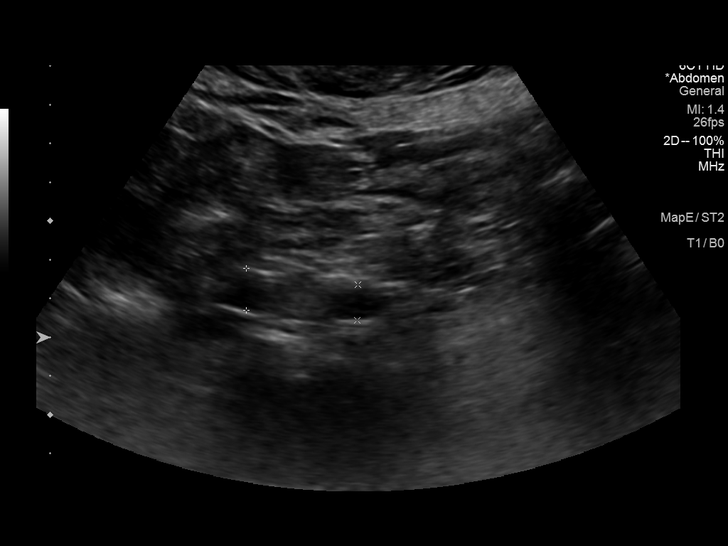

[12 of 12 positions shown; findings below may reference images not displayed]

FINDINGS: Abdominal aortic measurements as follows:

Proximal:  2.3 x 2.3 cm

Mid:  1.9 x 1.9 cm

Distal:  1.7 x 1.9 cm

No periaortic fluid or adenopathy.
IMPRESSION: No demonstrable abdominal aortic aneurysm. No abnormality
appreciable.

## 2020-07-30 ENCOUNTER — Other Ambulatory Visit: Payer: Self-pay | Admitting: Internal Medicine

## 2020-07-30 DIAGNOSIS — I82403 Acute embolism and thrombosis of unspecified deep veins of lower extremity, bilateral: Secondary | ICD-10-CM

## 2020-07-31 ENCOUNTER — Ambulatory Visit
Admission: RE | Admit: 2020-07-31 | Discharge: 2020-07-31 | Disposition: A | Discharge status: Home / Self Care | Payer: 59 | Source: Ambulatory Visit | Attending: Internal Medicine | Admitting: Internal Medicine

## 2020-07-31 DIAGNOSIS — I82403 Acute embolism and thrombosis of unspecified deep veins of lower extremity, bilateral: Secondary | ICD-10-CM
